# Patient Record
Sex: Male | Born: 1965 | Race: Black or African American | Hispanic: No | Smoking: Current every day smoker
Health system: Southern US, Community
[De-identification: ages and names within clinical notes are randomized; demographics above are authoritative.]

## PROBLEM LIST (undated history)

## (undated) DIAGNOSIS — E119 Type 2 diabetes mellitus without complications: Secondary | ICD-10-CM

## (undated) DIAGNOSIS — I10 Essential (primary) hypertension: Secondary | ICD-10-CM

## (undated) HISTORY — PX: NO PAST SURGERIES: SHX2092

---

## 2012-12-30 ENCOUNTER — Emergency Department (HOSPITAL_COMMUNITY)
Admission: EM | Admit: 2012-12-30 | Discharge: 2012-12-30 | Disposition: A | Discharge status: Home / Self Care | Payer: Medicare Other | Attending: Emergency Medicine | Admitting: Emergency Medicine

## 2012-12-30 ENCOUNTER — Encounter (HOSPITAL_COMMUNITY): Payer: Self-pay | Admitting: Nurse Practitioner

## 2012-12-30 DIAGNOSIS — M545 Low back pain, unspecified: Secondary | ICD-10-CM | POA: Insufficient documentation

## 2012-12-30 DIAGNOSIS — I10 Essential (primary) hypertension: Secondary | ICD-10-CM | POA: Insufficient documentation

## 2012-12-30 DIAGNOSIS — Z794 Long term (current) use of insulin: Secondary | ICD-10-CM | POA: Insufficient documentation

## 2012-12-30 DIAGNOSIS — F17200 Nicotine dependence, unspecified, uncomplicated: Secondary | ICD-10-CM | POA: Insufficient documentation

## 2012-12-30 DIAGNOSIS — E119 Type 2 diabetes mellitus without complications: Secondary | ICD-10-CM | POA: Insufficient documentation

## 2012-12-30 DIAGNOSIS — Z760 Encounter for issue of repeat prescription: Secondary | ICD-10-CM | POA: Insufficient documentation

## 2012-12-30 DIAGNOSIS — Z79899 Other long term (current) drug therapy: Secondary | ICD-10-CM | POA: Insufficient documentation

## 2012-12-30 DIAGNOSIS — G8929 Other chronic pain: Secondary | ICD-10-CM | POA: Insufficient documentation

## 2012-12-30 HISTORY — DX: Essential (primary) hypertension: I10

## 2012-12-30 HISTORY — DX: Type 2 diabetes mellitus without complications: E11.9

## 2012-12-30 MED ORDER — VERAPAMIL HCL 80 MG PO TABS: 80.0000 mg | ORAL_TABLET | Freq: Three times a day (TID) | ORAL | Status: DC

## 2012-12-30 MED ORDER — INSULIN GLARGINE 100 UNIT/ML ~~LOC~~ SOLN: 80.0000 [IU] | Freq: Every day | SUBCUTANEOUS | Status: DC

## 2012-12-30 MED ORDER — OXYCODONE-ACETAMINOPHEN 5-325 MG PO TABS: ORAL_TABLET | ORAL | Status: DC

## 2012-12-30 MED ORDER — OXYCODONE-ACETAMINOPHEN 5-325 MG PO TABS
2.0000 | ORAL_TABLET | Freq: Once | ORAL | Status: AC
  Administered 2012-12-30: 2 via ORAL
  Filled 2012-12-30: qty 2

## 2012-12-30 MED ORDER — INSULIN ASPART 100 UNIT/ML ~~LOC~~ SOLN: 15.0000 [IU] | Freq: Three times a day (TID) | SUBCUTANEOUS | Status: DC

## 2012-12-30 MED ORDER — HYDROCHLOROTHIAZIDE 12.5 MG PO TABS: 12.5000 mg | ORAL_TABLET | Freq: Every day | ORAL | Status: DC

## 2012-12-30 NOTE — ED Notes (Signed)
Pt reports lower back pain radiating down BLE x 2 weeks, denies past history of back pain, denies injuries. Also pt has hx htn and ran out of meds when he moved here 2 weeks ago.

## 2012-12-30 NOTE — ED Provider Notes (Signed)
CSN: [id number]     Arrival date & time 12/30/12  1138 History     First MD Initiated Contact with Patient 12/30/12 1221     Chief Complaint  Patient presents with  . Back Pain   (Consider location/radiation/quality/duration/timing/severity/associated sxs/prior Treatment) HPI  Bradley Mason is a 47 y.o. male complaining of exacerbation of chronic low back pain. Patient complains of low back pain radiating down the bilateral lower extremities just past the knee for the last 2 weeks. He states this is typical of his prior exacerbations. He denies any injuries, fever, change in bowel or bladder habits, history of IV drug use or cancer, numbness or weakness. He said recently moved to the area, has not established primary care, has been out of unknown hypertension medication for approximately one month he states that he is low on his insulin as well. Patient denies any chest pain, shortness of breath, headache, neck pain, change in vision, dysarthria, ataxia, abdominal pain, nausea vomiting.   Past Medical History  Diagnosis Date  . Diabetes mellitus without complication   . Hypertension    History reviewed. No pertinent past surgical history. History reviewed. No pertinent family history. History  Substance Use Topics  . Smoking status: Current Every Day Smoker    Types: Cigarettes  . Smokeless tobacco: Not on file  . Alcohol Use: No    Review of Systems 10 systems reviewed and found to be negative, except as noted in the HPI   Allergies  Review of patient's allergies indicates no known allergies.  Home Medications   Current Outpatient Rx  Name  Route  Sig  Dispense  Refill  . insulin aspart (NOVOLOG) 100 UNIT/ML injection   Subcutaneous   Inject 15 Units into the skin 3 (three) times daily with meals.         . insulin glargine (LANTUS) 100 UNIT/ML injection   Subcutaneous   Inject 80 Units into the skin at bedtime.         Marland Kitchen PRESCRIPTION MEDICATION   Oral   Take 1  tablet by mouth daily. Blood pressure medication          BP 163/106  Temp(Src) 97.9 F (36.6 C) (Oral)  Resp 20  SpO2 98% Physical Exam  Nursing note and vitals reviewed. Constitutional: He is oriented to person, place, and time. He appears well-developed and well-nourished. No distress.  HENT:  Head: Normocephalic.  Mouth/Throat: No oropharyngeal exudate.  Eyes: Conjunctivae and EOM are normal. Pupils are equal, round, and reactive to light.  Neck: Normal range of motion.  Cardiovascular: Normal rate, regular rhythm and intact distal pulses.   Pulmonary/Chest: Effort normal and breath sounds normal. No stridor. No respiratory distress. He has no wheezes. He has no rales. He exhibits no tenderness.  Abdominal: Soft. Bowel sounds are normal. He exhibits no distension and no mass. There is no tenderness. There is no rebound and no guarding.  Musculoskeletal: Normal range of motion.  Strength is 5 out of 5 to bilateral lower extremities at hip and knee, extensor hallucis longus 5 out of 5. Ankle strength 5 out of 5, no clonus, neurovascularly intact.  Neurological: He is alert and oriented to person, place, and time.  Psychiatric: He has a normal mood and affect.    ED Course   Procedures (including critical care time)  Labs Reviewed - No data to display No results found. No diagnosis found.  MDM   Filed Vitals:   12/30/12 1145  BP: 163/106  Temp: 97.9 F (36.6 C)  TempSrc: Oral  Resp: 20  SpO2: 98%     Bradley Mason is a 47 y.o. male presenting with elevated blood pressure secondary to noncompliance, he patient has a history of hypertension and insulin-dependent diabetes he does not have a primary care doctor and is out of his medications. Patient also is complaining of exacerbation of chronic low back pain. No Red Flags. Patient's meds will be refilled and patient will be asked to follow with private hospitalist wellness clinic. Return precautions  discussed.  Medications  oxyCODONE-acetaminophen (PERCOCET/ROXICET) 5-325 MG per tablet 2 tablet (2 tablets Oral Given 12/30/12 1308)    Pt is hemodynamically stable, appropriate for, and amenable to discharge at this time. Pt verbalized understanding and agrees with care plan. All questions answered. Outpatient follow-up and specific return precautions discussed.    Discharge Medication List as of 12/30/2012 12:52 PM    START taking these medications   Details  hydrochlorothiazide (HYDRODIURIL) 12.5 MG tablet Take 1 tablet (12.5 mg total) by mouth daily. Take in the AM, Starting 12/30/2012, Until Discontinued, Print    !! insulin aspart (NOVOLOG) 100 UNIT/ML injection Inject 15 Units into the skin 3 (three) times daily with meals., Starting 12/30/2012, Until Discontinued, Print    !! insulin glargine (LANTUS) 100 UNIT/ML injection Inject 0.8 mLs (80 Units total) into the skin at bedtime., Starting 12/30/2012, Until Discontinued, Print    oxyCODONE-acetaminophen (PERCOCET/ROXICET) 5-325 MG per tablet 1 to 2 tabs PO q6hrs  PRN for pain, Print    verapamil (CALAN) 80 MG tablet Take 1 tablet (80 mg total) by mouth 3 (three) times daily., Starting 12/30/2012, Until Discontinued, Print     !! - Potential duplicate medications found. Please discuss with provider.      Note: Portions of this report may have been transcribed using voice recognition software. Every effort was made to ensure accuracy; however, inadvertent computerized transcription errors may be present    Wynetta Emery, PA-C 12/31/12 [phone]

## 2012-12-31 NOTE — ED Provider Notes (Signed)
Medical screening examination/treatment/procedure(s) were performed by non-physician practitioner and as supervising physician I was immediately available for consultation/collaboration.  Danyon Mcginness L Aseel Uhde, MD 12/31/12 1056 

## 2013-01-09 ENCOUNTER — Ambulatory Visit: Payer: Medicare Other

## 2013-02-09 ENCOUNTER — Emergency Department (HOSPITAL_COMMUNITY)
Admission: EM | Admit: 2013-02-09 | Discharge: 2013-02-09 | Disposition: A | Discharge status: Home / Self Care | Payer: Medicare Other | Attending: Emergency Medicine | Admitting: Emergency Medicine

## 2013-02-09 ENCOUNTER — Encounter (HOSPITAL_COMMUNITY): Payer: Self-pay | Admitting: Emergency Medicine

## 2013-02-09 DIAGNOSIS — R7309 Other abnormal glucose: Secondary | ICD-10-CM | POA: Insufficient documentation

## 2013-02-09 DIAGNOSIS — F17200 Nicotine dependence, unspecified, uncomplicated: Secondary | ICD-10-CM | POA: Insufficient documentation

## 2013-02-09 DIAGNOSIS — Z79899 Other long term (current) drug therapy: Secondary | ICD-10-CM | POA: Insufficient documentation

## 2013-02-09 DIAGNOSIS — Z794 Long term (current) use of insulin: Secondary | ICD-10-CM | POA: Insufficient documentation

## 2013-02-09 DIAGNOSIS — E860 Dehydration: Secondary | ICD-10-CM | POA: Insufficient documentation

## 2013-02-09 DIAGNOSIS — I10 Essential (primary) hypertension: Secondary | ICD-10-CM | POA: Insufficient documentation

## 2013-02-09 DIAGNOSIS — R42 Dizziness and giddiness: Secondary | ICD-10-CM

## 2013-02-09 DIAGNOSIS — E119 Type 2 diabetes mellitus without complications: Secondary | ICD-10-CM | POA: Insufficient documentation

## 2013-02-09 DIAGNOSIS — R739 Hyperglycemia, unspecified: Secondary | ICD-10-CM

## 2013-02-09 DIAGNOSIS — R000 Tachycardia, unspecified: Secondary | ICD-10-CM | POA: Insufficient documentation

## 2013-02-09 LAB — POCT I-STAT 3, ART BLOOD GAS (G3+)
Acid-base deficit: 3 mmol/L — ABNORMAL HIGH (ref 0.0–2.0)
Bicarbonate: 21.7 mEq/L (ref 20.0–24.0)
O2 Saturation: 95 %
Patient temperature: 98.6
TCO2: 23 mmol/L (ref 0–100)
pO2, Arterial: 75 mmHg — ABNORMAL LOW (ref 80.0–100.0)

## 2013-02-09 LAB — BASIC METABOLIC PANEL
Chloride: 94 mEq/L — ABNORMAL LOW (ref 96–112)
GFR calc Af Amer: >90 mL/min (ref >90)
GFR calc non Af Amer: >90 mL/min (ref >90)
Potassium: 5.5 mEq/L — ABNORMAL HIGH (ref 3.5–5.1)
Sodium: 130 mEq/L — ABNORMAL LOW (ref 135–145)

## 2013-02-09 LAB — CBC
HCT: 46 % (ref 39.0–52.0)
Hemoglobin: 16.4 g/dL (ref 13.0–17.0)
MCHC: 35.7 g/dL (ref 30.0–36.0)
RDW: 14 % (ref 11.5–15.5)
WBC: 8.9 10*3/uL (ref 4.0–10.5)

## 2013-02-09 LAB — GLUCOSE, CAPILLARY: Glucose-Capillary: 501 mg/dL — ABNORMAL HIGH (ref 70–99)

## 2013-02-09 MED ORDER — MECLIZINE HCL 25 MG PO TABS: 25.0000 mg | ORAL_TABLET | Freq: Three times a day (TID) | ORAL | Status: AC | PRN

## 2013-02-09 MED ORDER — MECLIZINE HCL 25 MG PO TABS
25.0000 mg | ORAL_TABLET | Freq: Once | ORAL | Status: AC
  Administered 2013-02-09: 25 mg via ORAL
  Filled 2013-02-09: qty 1

## 2013-02-09 MED ORDER — SODIUM CHLORIDE 0.9 % IV BOLUS (SEPSIS)
1000.0000 mL | Freq: Once | INTRAVENOUS | Status: AC
  Administered 2013-02-09: 1000 mL via INTRAVENOUS

## 2013-02-09 MED ORDER — INSULIN ASPART 100 UNIT/ML ~~LOC~~ SOLN
10.0000 [IU] | Freq: Once | SUBCUTANEOUS | Status: AC
  Administered 2013-02-09: 10 [IU] via INTRAVENOUS
  Filled 2013-02-09: qty 1

## 2013-02-09 NOTE — ED Notes (Signed)
Called CT to check the status on his scan.  There are still 4 people in from of him

## 2013-02-09 NOTE — ED Notes (Signed)
Pt here c/o dizziness upon waking this am with "room spinning" and worse with position change

## 2013-02-09 NOTE — ED Provider Notes (Signed)
CSN: [id number]     Arrival date & time 02/09/13  1510 History   First MD Initiated Contact with Patient 02/09/13 1732     Chief Complaint  Patient presents with  . Dizziness   (Consider location/radiation/quality/duration/timing/severity/associated sxs/prior Treatment) HPI Comments: 47 yo male with uncontrolled DM, HTN presents with intermittent vertigo since 4 am. No hx of similar.  No other concerns.  Walking okay.  Worse with position.  No ringing.  No stroke hx.  No change in insulin or dosing.  Improves with lying still. Smoker.  The history is provided by the patient.    Past Medical History  Diagnosis Date  . Diabetes mellitus without complication   . Hypertension    History reviewed. No pertinent past surgical history. History reviewed. No pertinent family history. History  Substance Use Topics  . Smoking status: Current Every Day Smoker    Types: Cigarettes  . Smokeless tobacco: Not on file  . Alcohol Use: No    Review of Systems  Constitutional: Negative for fever and chills.  HENT: Negative for neck pain and neck stiffness.   Eyes: Negative for visual disturbance.  Respiratory: Negative for shortness of breath.   Cardiovascular: Negative for chest pain.  Gastrointestinal: Negative for vomiting and abdominal pain.  Genitourinary: Negative for dysuria and flank pain.  Musculoskeletal: Negative for back pain.  Skin: Negative for rash.  Neurological: Positive for dizziness and light-headedness. Negative for syncope, weakness and headaches.    Allergies  Review of patient's allergies indicates no known allergies.  Home Medications   Current Outpatient Rx  Name  Route  Sig  Dispense  Refill  . hydrochlorothiazide (HYDRODIURIL) 12.5 MG tablet   Oral   Take 1 tablet (12.5 mg total) by mouth daily. Take in the AM   30 tablet   1   . insulin aspart (NOVOLOG) 100 UNIT/ML injection   Subcutaneous   Inject 15 Units into the skin 3 (three) times daily with  meals.         . insulin aspart (NOVOLOG) 100 UNIT/ML injection   Subcutaneous   Inject 15 Units into the skin 3 (three) times daily with meals.   1 vial   12   . insulin glargine (LANTUS) 100 UNIT/ML injection   Subcutaneous   Inject 80 Units into the skin at bedtime.         . insulin glargine (LANTUS) 100 UNIT/ML injection   Subcutaneous   Inject 0.8 mLs (80 Units total) into the skin at bedtime.   10 mL   12   . lisinopril (PRINIVIL,ZESTRIL) 20 MG tablet   Oral   Take 20 mg by mouth daily.         Marland Kitchen oxyCODONE-acetaminophen (PERCOCET/ROXICET) 5-325 MG per tablet      1 to 2 tabs PO q6hrs  PRN for pain   15 tablet   0   . verapamil (CALAN) 80 MG tablet   Oral   Take 1 tablet (80 mg total) by mouth 3 (three) times daily.   90 tablet   2    BP 124/97  Pulse 109  Temp(Src) 99.1 F (37.3 C) (Oral)  Resp 22  SpO2 97% Physical Exam  Nursing note and vitals reviewed. Constitutional: He is oriented to person, place, and time. He appears well-developed and well-nourished.  HENT:  Head: Normocephalic and atraumatic.  Mild dry mm  Eyes: Conjunctivae are normal. Right eye exhibits no discharge. Left eye exhibits no discharge.  Neck: Normal range  of motion. Neck supple. No tracheal deviation present.  Cardiovascular: Regular rhythm.  Tachycardia present.   Pulmonary/Chest: Effort normal and breath sounds normal.  Abdominal: Soft. He exhibits no distension. There is no tenderness. There is no guarding.  Musculoskeletal: He exhibits no edema.  Neurological: He is alert and oriented to person, place, and time. He has normal strength. No cranial nerve deficit. Coordination normal. GCS eye subscore is 4. GCS verbal subscore is 5. GCS motor subscore is 6.  5+ strength in UE and LE with f/e at major joints. Sensation to palpation intact in UE and LE. CNs 2-12 grossly intact.  EOMFI.  PERRL.   Finger nose and coordination intact bilateral.   Visual fields intact to finger  testing.   Skin: Skin is warm. No rash noted.  Psychiatric: He has a normal mood and affect.    ED Course  Procedures (including critical care time) Dix hallpike and Epley maneuver Labs Review Labs Reviewed  CBC - Abnormal; Notable for the following:    RBC 5.92 (*)    MCV 77.7 (*)    All other components within normal limits  BASIC METABOLIC PANEL - Abnormal; Notable for the following:    Sodium 130 (*)    Potassium 5.5 (*)    Chloride 94 (*)    Glucose, Bld 617 (*)    All other components within normal limits  GLUCOSE, CAPILLARY - Abnormal; Notable for the following:    Glucose-Capillary 547 (*)    All other components within normal limits  GLUCOSE, CAPILLARY - Abnormal; Notable for the following:    Glucose-Capillary 501 (*)    All other components within normal limits  POTASSIUM   Imaging Review No results found.  MDM  No diagnosis found. Clinically uncontrolled DM - fluids and insulin given.  Bicarb normal. VBG to check pH. Only concern is vertigo.  Normal neuro exam - brought on in ED with head movement. Dix hallpike with mild horiz nystagmus to left followed by Epley with mild improvement.  CT head to look for other cause. Clinically periph vertigo but pt does have two risk factors for stroke. Vertigo resolved with epley and fluids.  CT delayed due to busy department.  Pt wishes to go home and not wait for CT and see pcp on Monday since he has no sxs.  He understands that we cannot rule out  CNS diagnosis but with normal neuro exam/ no sxs on recheck stroke unlikely.  DC with meclizine Glu improved in ED.  Wife with pt.   Hyperglycemia, Vertigo, Dehydration  Enid Skeens, MD 02/09/13 2027

## 2013-05-17 ENCOUNTER — Inpatient Hospital Stay (HOSPITAL_COMMUNITY)
Admission: EM | Admit: 2013-05-17 | Discharge: 2013-05-19 | DRG: 638 | Disposition: A | Discharge status: Home / Self Care | Payer: Medicare Other | Attending: Internal Medicine | Admitting: Internal Medicine

## 2013-05-17 ENCOUNTER — Encounter (HOSPITAL_COMMUNITY): Payer: Self-pay | Admitting: Emergency Medicine

## 2013-05-17 ENCOUNTER — Emergency Department (HOSPITAL_COMMUNITY): Payer: Medicare Other

## 2013-05-17 DIAGNOSIS — I10 Essential (primary) hypertension: Secondary | ICD-10-CM | POA: Diagnosis present

## 2013-05-17 DIAGNOSIS — K5289 Other specified noninfective gastroenteritis and colitis: Secondary | ICD-10-CM | POA: Diagnosis present

## 2013-05-17 DIAGNOSIS — E871 Hypo-osmolality and hyponatremia: Secondary | ICD-10-CM | POA: Diagnosis present

## 2013-05-17 DIAGNOSIS — E1310 Other specified diabetes mellitus with ketoacidosis without coma: Principal | ICD-10-CM | POA: Diagnosis present

## 2013-05-17 DIAGNOSIS — Z91199 Patient's noncompliance with other medical treatment and regimen due to unspecified reason: Secondary | ICD-10-CM

## 2013-05-17 DIAGNOSIS — Z9119 Patient's noncompliance with other medical treatment and regimen: Secondary | ICD-10-CM

## 2013-05-17 DIAGNOSIS — F17200 Nicotine dependence, unspecified, uncomplicated: Secondary | ICD-10-CM

## 2013-05-17 DIAGNOSIS — I959 Hypotension, unspecified: Secondary | ICD-10-CM | POA: Diagnosis present

## 2013-05-17 DIAGNOSIS — R197 Diarrhea, unspecified: Secondary | ICD-10-CM | POA: Diagnosis present

## 2013-05-17 DIAGNOSIS — E876 Hypokalemia: Secondary | ICD-10-CM | POA: Diagnosis present

## 2013-05-17 DIAGNOSIS — Z823 Family history of stroke: Secondary | ICD-10-CM

## 2013-05-17 DIAGNOSIS — R112 Nausea with vomiting, unspecified: Secondary | ICD-10-CM | POA: Diagnosis present

## 2013-05-17 DIAGNOSIS — E1110 Type 2 diabetes mellitus with ketoacidosis without coma: Secondary | ICD-10-CM | POA: Diagnosis present

## 2013-05-17 DIAGNOSIS — Z794 Long term (current) use of insulin: Secondary | ICD-10-CM

## 2013-05-17 DIAGNOSIS — Z833 Family history of diabetes mellitus: Secondary | ICD-10-CM

## 2013-05-17 DIAGNOSIS — Z720 Tobacco use: Secondary | ICD-10-CM | POA: Diagnosis present

## 2013-05-17 LAB — URINALYSIS, ROUTINE W REFLEX MICROSCOPIC
Bilirubin Urine: NEGATIVE
Glucose, UA: >1000 mg/dL — AB
Hgb urine dipstick: NEGATIVE
Ketones, ur: 15 mg/dL — AB
Leukocytes, UA: NEGATIVE
Nitrite: NEGATIVE
Protein, ur: 30 mg/dL — AB
Specific Gravity, Urine: 1.027 (ref 1.005–1.030)
Urobilinogen, UA: 0.2 mg/dL (ref 0.0–1.0)
pH: 5 (ref 5.0–8.0)

## 2013-05-17 LAB — RAPID URINE DRUG SCREEN, HOSP PERFORMED
Amphetamines: NOT DETECTED
Barbiturates: NOT DETECTED
Benzodiazepines: NOT DETECTED
Cocaine: NOT DETECTED
Opiates: NOT DETECTED
Tetrahydrocannabinol: NOT DETECTED

## 2013-05-17 LAB — COMPREHENSIVE METABOLIC PANEL
ALT: 14 U/L (ref 0–53)
Albumin: 3.4 g/dL — ABNORMAL LOW (ref 3.5–5.2)
Alkaline Phosphatase: 92 U/L (ref 39–117)
CO2: 17 mEq/L — ABNORMAL LOW (ref 19–32)
Calcium: 8.8 mg/dL (ref 8.4–10.5)
GFR calc Af Amer: >90 mL/min (ref >90)
Glucose, Bld: 386 mg/dL — ABNORMAL HIGH (ref 70–99)
Potassium: 4 mEq/L (ref 3.5–5.1)
Sodium: 129 mEq/L — ABNORMAL LOW (ref 135–145)
Total Protein: 7.1 g/dL (ref 6.0–8.3)

## 2013-05-17 LAB — CBC
Hemoglobin: 14.6 g/dL (ref 13.0–17.0)
MCH: 26.6 pg (ref 26.0–34.0)
MCV: 77.2 fL — ABNORMAL LOW (ref 78.0–100.0)
RBC: 5.49 MIL/uL (ref 4.22–5.81)
RDW: 13.4 % (ref 11.5–15.5)
WBC: 7.8 10*3/uL (ref 4.0–10.5)

## 2013-05-17 LAB — BASIC METABOLIC PANEL
BUN: 10 mg/dL (ref 6–23)
CO2: 20 mEq/L (ref 19–32)
Calcium: 8.2 mg/dL — ABNORMAL LOW (ref 8.4–10.5)
Chloride: 101 mEq/L (ref 96–112)
Creatinine, Ser: 0.65 mg/dL (ref 0.50–1.35)
Glucose, Bld: 218 mg/dL — ABNORMAL HIGH (ref 70–99)
Sodium: 133 mEq/L — ABNORMAL LOW (ref 135–145)

## 2013-05-17 LAB — CBC WITH DIFFERENTIAL/PLATELET
Basophils Absolute: 0 10*3/uL (ref 0.0–0.1)
Basophils Relative: 0 % (ref 0–1)
Eosinophils Absolute: 0.1 10*3/uL (ref 0.0–0.7)
Eosinophils Relative: 1 % (ref 0–5)
HCT: 45.1 % (ref 39.0–52.0)
Hemoglobin: 15.9 g/dL (ref 13.0–17.0)
Lymphocytes Relative: 15 % (ref 12–46)
Lymphs Abs: 1.6 10*3/uL (ref 0.7–4.0)
MCH: 27.3 pg (ref 26.0–34.0)
MCHC: 35.3 g/dL (ref 30.0–36.0)
MCV: 77.5 fL — ABNORMAL LOW (ref 78.0–100.0)
Monocytes Absolute: 0.5 K/uL (ref 0.1–1.0)
Monocytes Relative: 4 % (ref 3–12)
Neutro Abs: 8.5 10*3/uL — ABNORMAL HIGH (ref 1.7–7.7)
Neutrophils Relative %: 80 % — ABNORMAL HIGH (ref 43–77)
Platelets: 221 10*3/uL (ref 150–400)
RBC: 5.82 MIL/uL — ABNORMAL HIGH (ref 4.22–5.81)
RDW: 13.5 % (ref 11.5–15.5)
WBC: 10.6 10*3/uL — ABNORMAL HIGH (ref 4.0–10.5)

## 2013-05-17 LAB — COMPREHENSIVE METABOLIC PANEL WITH GFR
AST: 14 U/L (ref 0–37)
BUN: 13 mg/dL (ref 6–23)
Chloride: 95 meq/L — ABNORMAL LOW (ref 96–112)
Creatinine, Ser: 0.9 mg/dL (ref 0.50–1.35)
GFR calc non Af Amer: >90 mL/min (ref >90)
Total Bilirubin: 0.3 mg/dL (ref 0.3–1.2)

## 2013-05-17 LAB — GLUCOSE, CAPILLARY
Glucose-Capillary: 344 mg/dL — ABNORMAL HIGH (ref 70–99)
Glucose-Capillary: 353 mg/dL — ABNORMAL HIGH (ref 70–99)
Glucose-Capillary: 302 mg/dL — ABNORMAL HIGH (ref 70–99)
Glucose-Capillary: 203 mg/dL — ABNORMAL HIGH (ref 70–99)
Glucose-Capillary: 248 mg/dL — ABNORMAL HIGH (ref 70–99)

## 2013-05-17 LAB — KETONES, QUALITATIVE: Acetone, Bld: NEGATIVE

## 2013-05-17 LAB — URINE MICROSCOPIC-ADD ON

## 2013-05-17 LAB — CG4 I-STAT (LACTIC ACID): Lactic Acid, Venous: 1.76 mmol/L (ref 0.5–2.2)

## 2013-05-17 LAB — MRSA PCR SCREENING: MRSA by PCR: NEGATIVE

## 2013-05-17 MED ORDER — POTASSIUM CHLORIDE CRYS ER 20 MEQ PO TBCR
20.0000 meq | EXTENDED_RELEASE_TABLET | Freq: Once | ORAL | Status: AC
  Administered 2013-05-17: 20 meq via ORAL
  Filled 2013-05-17: qty 1

## 2013-05-17 MED ORDER — SODIUM CHLORIDE 0.9 % IV SOLN
INTRAVENOUS | Status: DC
  Administered 2013-05-17: 19:00:00 via INTRAVENOUS

## 2013-05-17 MED ORDER — INSULIN REGULAR HUMAN 100 UNIT/ML IJ SOLN
INTRAMUSCULAR | Status: DC
  Administered 2013-05-17: 2.9 [IU]/h via INTRAVENOUS
  Filled 2013-05-17: qty 1

## 2013-05-17 MED ORDER — SODIUM CHLORIDE 0.9 % IV SOLN
INTRAVENOUS | Status: DC
  Filled 2013-05-17: qty 1

## 2013-05-17 MED ORDER — ENOXAPARIN SODIUM 40 MG/0.4ML ~~LOC~~ SOLN
40.0000 mg | SUBCUTANEOUS | Status: DC
  Administered 2013-05-17 – 2013-05-18 (×2): 40 mg via SUBCUTANEOUS
  Filled 2013-05-17 (×3): qty 0.4

## 2013-05-17 MED ORDER — POTASSIUM CHLORIDE 10 MEQ/100ML IV SOLN
10.0000 meq | INTRAVENOUS | Status: DC
  Administered 2013-05-17: 10 meq via INTRAVENOUS
  Filled 2013-05-17: qty 100

## 2013-05-17 MED ORDER — POTASSIUM CHLORIDE CRYS ER 20 MEQ PO TBCR: 20.0000 meq | EXTENDED_RELEASE_TABLET | ORAL | Status: DC

## 2013-05-17 MED ORDER — SODIUM CHLORIDE 0.9 % IV BOLUS (SEPSIS)
1000.0000 mL | Freq: Once | INTRAVENOUS | Status: AC
  Administered 2013-05-17: 1000 mL via INTRAVENOUS

## 2013-05-17 MED ORDER — SODIUM CHLORIDE 0.9 % IV SOLN
INTRAVENOUS | Status: DC
  Administered 2013-05-18: 09:00:00 via INTRAVENOUS

## 2013-05-17 MED ORDER — DEXTROSE 50 % IV SOLN: 25.0000 mL | INTRAVENOUS | Status: DC | PRN

## 2013-05-17 MED ORDER — DEXTROSE-NACL 5-0.45 % IV SOLN
INTRAVENOUS | Status: DC
  Administered 2013-05-17 – 2013-05-18 (×2): via INTRAVENOUS

## 2013-05-17 MED ORDER — HYDRALAZINE HCL 20 MG/ML IJ SOLN
10.0000 mg | INTRAMUSCULAR | Status: DC | PRN
  Administered 2013-05-17: 10 mg via INTRAVENOUS
  Filled 2013-05-17: qty 1

## 2013-05-17 MED ORDER — HYDROCODONE-ACETAMINOPHEN 5-325 MG PO TABS
2.0000 | ORAL_TABLET | Freq: Four times a day (QID) | ORAL | Status: DC | PRN
  Administered 2013-05-18 (×2): 2 via ORAL
  Filled 2013-05-17 (×2): qty 2

## 2013-05-17 MED ORDER — HYDROCODONE-ACETAMINOPHEN 5-325 MG PO TABS
1.0000 | ORAL_TABLET | Freq: Four times a day (QID) | ORAL | Status: DC | PRN
  Administered 2013-05-17: 1 via ORAL
  Filled 2013-05-17: qty 1

## 2013-05-17 NOTE — ED Notes (Signed)
Pt initially presented for med refills as he does not have a PCP and needs BP and DM meds refilled.  Upon coming back to triage pt became more sluggish and found to be hypotensive.  Pt now arousable to speech.  Pt c/o pain all over.

## 2013-05-17 NOTE — H&P (Signed)
[facility] History and Physical  Bradley Mason ZOX:[id number] DOB: [birth year] DOA: 05/17/2013  Referring physician: ER physician. PCP: No PCP Per Patient patient recently moved from [place] and does not have a PCP now.  Chief Complaint: Weakness.  HPI: Bradley Mason is a 47 y.o. male with history of diabetes mellitus hypertension and ongoing tobacco abuse had originally come to the ER to get medication refill when while waiting patient became weak and almost passed out. Patient was brought in to the ER and was found to be hypotensive with blood pressure in the 70s systolic. Patient was given 2 L normal saline bolus after which patient's blood pressure has improved with systolic more than 100 and heart rate has come down into the 60s. Patient denies any chest pain shortness of breath nausea vomiting. Patient did have some abdominal cramps with one episode of diarrhea after patient was admitted. Patient presently denies abdominal pain fever chills. Denies having used any recent antibiotics. Labs revealed elevated anion gap consistent with possible DKA. Patient states he has not been taking his medications for last 2 weeks because he ran out of it. Patient will be admitted for further management. Patient has been started on IV insulin infusion and IV fluids.   Review of Systems: As presented in the history of presenting illness, rest negative.  Past Medical History  Diagnosis Date  . Diabetes mellitus without complication   . Hypertension    Past Surgical History  Procedure Laterality Date  . No past surgeries     Social History:  reports that he has been smoking Cigarettes.  He has been smoking about 0.00 packs per day. He does not have any smokeless tobacco history on file. He reports that he does not drink alcohol or use illicit drugs. Where does patient live home. Can patient participate in ADLs? Yes.  No Known Allergies  Family History:  Family History  Problem Relation Age  of Onset  . Diabetes Mellitus II Mother   . Diabetes Mellitus II Father   . Diabetes Mellitus II Brother   . Stroke Brother       Prior to Admission medications   Medication Sig Start Date End Date Taking? Authorizing Provider  hydrochlorothiazide (HYDRODIURIL) 12.5 MG tablet Take 1 tablet (12.5 mg total) by mouth daily. Take in the AM 12/30/12  Yes Nicole Pisciotta, PA-C  insulin aspart (NOVOLOG) 100 UNIT/ML injection Inject 15 Units into the skin 3 (three) times daily with meals.   Yes Historical Provider, MD  insulin glargine (LANTUS) 100 UNIT/ML injection Inject 0.8 mLs (80 Units total) into the skin at bedtime. 12/30/12  Yes Nicole Pisciotta, PA-C  lisinopril (PRINIVIL,ZESTRIL) 20 MG tablet Take 20 mg by mouth daily.   Yes Historical Provider, MD  meclizine (ANTIVERT) 25 MG tablet Take 1 tablet (25 mg total) by mouth 3 (three) times daily as needed for dizziness. 02/09/13  Yes Enid Skeens, MD  verapamil (CALAN) 80 MG tablet Take 80 mg by mouth 2 (two) times daily. 12/30/12  Yes Nicole Pisciotta, PA-C    Physical Exam: Filed Vitals:   05/17/13 1630 05/17/13 1700 05/17/13 1715 05/17/13 1828  BP: 125/89 124/78 121/81 116/78  Pulse: 103 106 102 108  Temp:      TempSrc:      Resp: 23 28 23 20   SpO2: 98% 98% 98% 97%     General:  Well-developed and nourished.  Eyes: Anicteric no pallor.  ENT: No discharge from ears eyes nose mouth.  Neck: No mass  felt.  Cardiovascular: S1-S2 heard.  Respiratory: No rhonchi or crepitations.  Abdomen: Soft nontender bowel sounds present no guarding no rigidity.  Skin: No rash.  Musculoskeletal: No edema.  Psychiatric: Appears normal.  Neurologic: Alert awake oriented to time place and person. Moves all extremities.  Labs on Admission:  Basic Metabolic Panel:  Recent Labs Lab 05/17/13 1456  NA 129*  K 4.0  CL 95*  CO2 17*  GLUCOSE 386*  BUN 13  CREATININE 0.90  CALCIUM 8.8   Liver Function Tests:  Recent Labs Lab  05/17/13 1456  AST 14  ALT 14  ALKPHOS 92  BILITOT 0.3  PROT 7.1  ALBUMIN 3.4*   No results found for this basename: LIPASE, AMYLASE,  in the last 168 hours No results found for this basename: AMMONIA,  in the last 168 hours CBC:  Recent Labs Lab 05/17/13 1509  WBC 10.6*  NEUTROABS 8.5*  HGB 15.9  HCT 45.1  MCV 77.5*  PLT 221   Cardiac Enzymes: No results found for this basename: CKTOTAL, CKMB, CKMBINDEX, TROPONINI,  in the last 168 hours  BNP (last 3 results) No results found for this basename: PROBNP,  in the last 8760 hours CBG:  Recent Labs Lab 05/17/13 1457 05/17/13 1537 05/17/13 1854  GLUCAP 344* 353* 302*    Radiological Exams on Admission: Dg Chest Port 1 View  05/17/2013   CLINICAL DATA:  Chest pain.  EXAM: PORTABLE CHEST - 1 VIEW  COMPARISON:  None.  FINDINGS: Low volume chest. Cardiopericardial silhouette appears within normal limits allowing for volumes. Monitoring leads project over the chest. No focal consolidation. No pleural effusion is identified.  IMPRESSION: Low volume chest.   Electronically Signed   By: Andreas Newport M.D.   On: 05/17/2013 18:01     Assessment/Plan Principal Problem:   DKA (diabetic ketoacidoses) Active Problems:   Diarrhea   HTN (hypertension)   Tobacco abuse   1. Diabetic ketoacidosis - probably secondary to noncompliance with medication as patient has ran out of medication. Continue with IV insulin and IV fluids until anion gap gets corrected. After which we can start patient's home dose of Lantus. 2. Hypotension - probably secondary to dehydration. At this time I'm holding off patient's antihypertensives. Patient's hypotension did respond to IV fluids. Check cardiac markers and EKG. 3. History of hypertension - due to initial presentation of hypotension I'm holding off antihypertensives for now and keeping patient on when necessary IV hydralazine for systolic blood pressure more than 160. 4. Hyponatremia - probably  from dehydration and hyperglycemia. Recheck metabolic panel closely after hydration. 5. Diarrhea - patient had one episode of crampy abdominal pain diarrhea. Presently on exam patient's abdomen appears benign. Closely observe. Check stool studies. 6. Tobacco abuse - tobacco cessation counseling requested.    Code Status: Full code.  Family Communication: None.  Disposition Plan: Admit to inpatient.    Jeshawn Melucci N. [facility] Pager [phone].  If 7PM-7AM, please contact night-coverage [email or web address] Password [facility] 05/17/2013, 6:57 PM

## 2013-05-17 NOTE — ED Notes (Signed)
EDP at bedside with ultrasound 

## 2013-05-17 NOTE — ED Notes (Signed)
CBG done in triage 

## 2013-05-17 NOTE — ED Provider Notes (Signed)
CSN: [id number]     Arrival date & time 05/17/13  1431 History   First MD Initiated Contact with Patient 05/17/13 1524     Chief Complaint  Patient presents with  . Hypotension  . Altered Mental Status   (Consider location/radiation/quality/duration/timing/severity/associated sxs/prior Treatment) HPI Patient presented today for medication refills. While in triage he began to be sluggish and found to be hypotensive. His initial blood pressure in triage was 70/50 one and he was pulled back to the emergency room immediately. Per the patient and his family member, he has had increased urination, mild abdominal pain, fatigue for a few days worsening here recently. He has had mild nausea but no vomiting.   Past Medical History  Diagnosis Date  . Diabetes mellitus without complication   . Hypertension    Past Surgical History  Procedure Laterality Date  . No past surgeries     Family History  Problem Relation Age of Onset  . Diabetes Mellitus II Mother   . Diabetes Mellitus II Father   . Diabetes Mellitus II Brother   . Stroke Brother    History  Substance Use Topics  . Smoking status: Current Every Day Smoker    Types: Cigarettes  . Smokeless tobacco: Not on file  . Alcohol Use: No    Review of Systems  Constitutional: Positive for fatigue. Negative for fever and chills.  HENT: Negative for sore throat.   Eyes: Negative for pain.  Respiratory: Negative for cough and shortness of breath.   Cardiovascular: Negative for chest pain.  Gastrointestinal: Positive for nausea. Negative for vomiting and abdominal pain.  Endocrine: Positive for polydipsia and polyuria.  Genitourinary: Negative for dysuria and flank pain.  Musculoskeletal: Negative for back pain and neck pain.  Skin: Negative for rash.  Neurological: Negative for seizures and headaches.    Allergies  Review of patient's allergies indicates no known allergies.  Home Medications   No current outpatient  prescriptions on file. BP 129/74  Pulse 106  Temp(Src) 98.4 F (36.9 C) (Rectal)  Resp 29  Ht 5\' 7"  (1.702 m)  Wt 238 lb 5.1 oz (108.1 kg)  BMI 37.32 kg/m2  SpO2 98% Physical Exam  Constitutional: He is oriented to person, place, and time. He appears well-developed and well-nourished. No distress.  HENT:  Head: Normocephalic and atraumatic.  Eyes: Pupils are equal, round, and reactive to light.  Neck: Normal range of motion.  Cardiovascular: Regular rhythm and intact distal pulses.  Tachycardia present.   Pulmonary/Chest: Effort normal and breath sounds normal.  Abdominal: Soft. He exhibits no distension. There is no tenderness.  Musculoskeletal: Normal range of motion.  Neurological: He is alert and oriented to person, place, and time.  Skin: Skin is warm. He is not diaphoretic.   ED Course  Procedures (including critical care time) Labs Review Labs Reviewed  COMPREHENSIVE METABOLIC PANEL - Abnormal; Notable for the following:    Sodium 129 (*)    Chloride 95 (*)    CO2 17 (*)    Glucose, Bld 386 (*)    Albumin 3.4 (*)    All other components within normal limits  GLUCOSE, CAPILLARY - Abnormal; Notable for the following:    Glucose-Capillary 344 (*)    All other components within normal limits  CBC WITH DIFFERENTIAL - Abnormal; Notable for the following:    WBC 10.6 (*)    RBC 5.82 (*)    MCV 77.5 (*)    Neutrophils Relative % 80 (*)  Neutro Abs 8.5 (*)    All other components within normal limits  URINALYSIS, ROUTINE W REFLEX MICROSCOPIC - Abnormal; Notable for the following:    Glucose, UA >1000 (*)    Ketones, ur 15 (*)    Protein, ur 30 (*)    All other components within normal limits  GLUCOSE, CAPILLARY - Abnormal; Notable for the following:    Glucose-Capillary 353 (*)    All other components within normal limits  GLUCOSE, CAPILLARY - Abnormal; Notable for the following:    Glucose-Capillary 302 (*)    All other components within normal limits  URINE  MICROSCOPIC-ADD ON - Abnormal; Notable for the following:    Casts HYALINE CASTS (*)    All other components within normal limits  BASIC METABOLIC PANEL - Abnormal; Notable for the following:    Sodium 133 (*)    Glucose, Bld 218 (*)    Calcium 8.2 (*)    All other components within normal limits  CBC - Abnormal; Notable for the following:    MCV 77.2 (*)    All other components within normal limits  GLUCOSE, CAPILLARY - Abnormal; Notable for the following:    Glucose-Capillary 203 (*)    All other components within normal limits  GLUCOSE, CAPILLARY - Abnormal; Notable for the following:    Glucose-Capillary 212 (*)    All other components within normal limits  GLUCOSE, CAPILLARY - Abnormal; Notable for the following:    Glucose-Capillary 248 (*)    All other components within normal limits  GLUCOSE, CAPILLARY - Abnormal; Notable for the following:    Glucose-Capillary 191 (*)    All other components within normal limits  MRSA PCR SCREENING  URINE RAPID DRUG SCREEN (HOSP PERFORMED)  TROPONIN I  KETONES, QUALITATIVE  CBC WITH DIFFERENTIAL  TSH  LIPID PANEL  BASIC METABOLIC PANEL  BASIC METABOLIC PANEL  BASIC METABOLIC PANEL  CG4 I-STAT (LACTIC ACID)   Imaging Review Dg Chest Port 1 View  05/17/2013   CLINICAL DATA:  Chest pain.  EXAM: PORTABLE CHEST - 1 VIEW  COMPARISON:  None.  FINDINGS: Low volume chest. Cardiopericardial silhouette appears within normal limits allowing for volumes. Monitoring leads project over the chest. No focal consolidation. No pleural effusion is identified.  IMPRESSION: Low volume chest.   Electronically Signed   By: Andreas Newport M.D.   On: 05/17/2013 18:01    EKG Interpretation    Date/Time:    Ventricular Rate:    PR Interval:    QRS Duration:   QT Interval:    QTC Calculation:   R Axis:     Text Interpretation:              MDM   1. DKA (diabetic ketoacidoses)   2. Diarrhea   3. HTN (hypertension)   4. Tobacco abuse     47 yo M with hx of poorly controlled diabetes presents for AMS, hypotension.   BSUS performed as [facility] protocol. No change in aortic size. No evidence of ptx. No cardiac effusion. No intraabdominal fluid. Will perform basic labs, aggressively fluid resuscitate.   After 2 L NS IVF, patient normotensive. Likely dehydration 2/2 DKA. Patient mildly acidotic. Will start on insulin drip, admit to hospitalist service. Patient triaged to stepdown unit, admitted in stable condition. Patient seen and evaluated by myself and my attending, Dr. Oletta Lamas.      Imagene Sheller, MD 05/18/13 [phone]

## 2013-05-17 NOTE — ED Notes (Signed)
DR. Oletta Lamas NOTIFIED OF PATIENTS LAB RESULTS OF CG4+ LACTIC ACID = 1.76 mmoI/L ,@ 15:45 PM, 05/17/2013.

## 2013-05-18 DIAGNOSIS — E871 Hypo-osmolality and hyponatremia: Secondary | ICD-10-CM | POA: Diagnosis present

## 2013-05-18 DIAGNOSIS — E876 Hypokalemia: Secondary | ICD-10-CM | POA: Diagnosis not present

## 2013-05-18 DIAGNOSIS — R112 Nausea with vomiting, unspecified: Secondary | ICD-10-CM | POA: Diagnosis present

## 2013-05-18 LAB — BASIC METABOLIC PANEL
BUN: 8 mg/dL (ref 6–23)
BUN: 8 mg/dL (ref 6–23)
CO2: 20 mEq/L (ref 19–32)
Calcium: 8.1 mg/dL — ABNORMAL LOW (ref 8.4–10.5)
Calcium: 7.9 mg/dL — ABNORMAL LOW (ref 8.4–10.5)
Calcium: 8 mg/dL — ABNORMAL LOW (ref 8.4–10.5)
Chloride: 101 mEq/L (ref 96–112)
Creatinine, Ser: 0.64 mg/dL (ref 0.50–1.35)
Creatinine, Ser: 0.61 mg/dL (ref 0.50–1.35)
Creatinine, Ser: 0.58 mg/dL (ref 0.50–1.35)
GFR calc Af Amer: >90 mL/min (ref >90)
GFR calc Af Amer: >90 mL/min (ref >90)
GFR calc non Af Amer: >90 mL/min (ref >90)
GFR calc non Af Amer: >90 mL/min (ref >90)
GFR calc non Af Amer: >90 mL/min (ref >90)
Glucose, Bld: 215 mg/dL — ABNORMAL HIGH (ref 70–99)
Glucose, Bld: 242 mg/dL — ABNORMAL HIGH (ref 70–99)
Glucose, Bld: 275 mg/dL — ABNORMAL HIGH (ref 70–99)

## 2013-05-18 LAB — GLUCOSE, CAPILLARY
Glucose-Capillary: 136 mg/dL — ABNORMAL HIGH (ref 70–99)
Glucose-Capillary: 191 mg/dL — ABNORMAL HIGH (ref 70–99)
Glucose-Capillary: 248 mg/dL — ABNORMAL HIGH (ref 70–99)
Glucose-Capillary: 283 mg/dL — ABNORMAL HIGH (ref 70–99)

## 2013-05-18 LAB — CBC WITH DIFFERENTIAL/PLATELET
Basophils Absolute: 0 10*3/uL (ref 0.0–0.1)
Basophils Relative: 0 % (ref 0–1)
Eosinophils Relative: 1 % (ref 0–5)
HCT: 42 % (ref 39.0–52.0)
Lymphocytes Relative: 32 % (ref 12–46)
MCV: 77.6 fL — ABNORMAL LOW (ref 78.0–100.0)
Monocytes Absolute: 0.4 10*3/uL (ref 0.1–1.0)
Monocytes Relative: 6 % (ref 3–12)
Neutro Abs: 4.2 10*3/uL (ref 1.7–7.7)
Neutrophils Relative %: 61 % (ref 43–77)
RBC: 5.41 MIL/uL (ref 4.22–5.81)
RDW: 13.5 % (ref 11.5–15.5)
WBC: 6.8 10*3/uL (ref 4.0–10.5)

## 2013-05-18 LAB — LIPID PANEL
Cholesterol: 171 mg/dL (ref 0–200)
HDL: 34 mg/dL — ABNORMAL LOW (ref >39)
LDL Cholesterol: 107 mg/dL — ABNORMAL HIGH (ref 0–99)
Triglycerides: 149 mg/dL (ref <150)

## 2013-05-18 LAB — HEMOGLOBIN A1C: Hgb A1c MFr Bld: 13.6 % — ABNORMAL HIGH (ref <5.7)

## 2013-05-18 LAB — TSH: TSH: 2.23 u[IU]/mL (ref 0.350–4.500)

## 2013-05-18 MED ORDER — INSULIN GLARGINE 100 UNIT/ML ~~LOC~~ SOLN
60.0000 [IU] | Freq: Every day | SUBCUTANEOUS | Status: DC
  Administered 2013-05-18 (×2): 60 [IU] via SUBCUTANEOUS
  Filled 2013-05-18 (×3): qty 0.6

## 2013-05-18 MED ORDER — INSULIN ASPART 100 UNIT/ML ~~LOC~~ SOLN: 0.0000 [IU] | Freq: Every day | SUBCUTANEOUS | Status: DC

## 2013-05-18 MED ORDER — ONDANSETRON HCL 4 MG/2ML IJ SOLN
4.0000 mg | Freq: Four times a day (QID) | INTRAMUSCULAR | Status: DC | PRN
  Administered 2013-05-19: 4 mg via INTRAVENOUS
  Filled 2013-05-18 (×2): qty 2

## 2013-05-18 MED ORDER — SODIUM CHLORIDE 0.9 % IV SOLN
INTRAVENOUS | Status: DC
  Administered 2013-05-18: 11:00:00 via INTRAVENOUS

## 2013-05-18 MED ORDER — POTASSIUM CHLORIDE CRYS ER 20 MEQ PO TBCR
40.0000 meq | EXTENDED_RELEASE_TABLET | Freq: Once | ORAL | Status: AC
  Administered 2013-05-18: 40 meq via ORAL
  Filled 2013-05-18: qty 2

## 2013-05-18 MED ORDER — INSULIN ASPART 100 UNIT/ML ~~LOC~~ SOLN: 6.0000 [IU] | Freq: Three times a day (TID) | SUBCUTANEOUS | Status: DC

## 2013-05-18 MED ORDER — MECLIZINE HCL 25 MG PO TABS
25.0000 mg | ORAL_TABLET | Freq: Three times a day (TID) | ORAL | Status: DC | PRN
  Filled 2013-05-18: qty 1

## 2013-05-18 MED ORDER — INSULIN ASPART 100 UNIT/ML ~~LOC~~ SOLN
0.0000 [IU] | Freq: Three times a day (TID) | SUBCUTANEOUS | Status: DC
  Administered 2013-05-18: 7 [IU] via SUBCUTANEOUS
  Administered 2013-05-18: 4 [IU] via SUBCUTANEOUS
  Administered 2013-05-18: 3 [IU] via SUBCUTANEOUS
  Administered 2013-05-19: 4 [IU] via SUBCUTANEOUS

## 2013-05-18 NOTE — Progress Notes (Signed)
[facility] PROGRESS NOTE  Bradley Mason EAV:[id number] DOB: May 21, 1966 DOA: 05/17/2013 PCP: No PCP Per Patient  Assessment/Plan: #1 DKA Likely secondary to medical  Noncompliance as patient recently moved from [place] does not have a PCP around of his medications approximately 2 weeks ago.urinalysis is negative. Chest x-ray is negative. Patient denies any chest pain or shortness of breath.first set of troponin was negative. Patient currently of glucose stabilizer. CBG currently 248. Discontinue D5 half-normal saline. Continue Lantus 60 units daily. Discontinue meal coverage insulin as patient not eating and nauseous. Continue sliding scale insulin. Check a hemoglobin A1c.place on clear liquids. Consult with the diabetic coordinator. Follow.  #2 hypotension Secondary to volume depletion. Blood pressure has responded to IV fluids. Antihypertensive medications on hold. Follow.  #3 nausea/ vomiting/ diarrhea Likely secondary to a gastroenteritis. Patient would 1 loose stool this morning. Patient with some nausea and episode of vomiting. Will discontinue current diet and place on clear liquids. Continue IV fluids. Stool studies pending.Supportive care.  #4 hypokalemia Replete.  #5 hyponatremia Secondary to hyperglycemia and dehydration. Follow.  #6 tobacco abuse Tobacco cessation.  #7 prophylaxis PPI for GI prophylaxis.Lovenox for DVT prophylaxis.  Code Status: full Family Communication: updated patient no family at bedside. Disposition Plan: home when medically stable   Consultants:  none  Procedures:  Chest x-ray 05/17/2013  Antibiotics:  none  HPI/Subjective: Patient states he feels bad. Patient with nausea. Patient states had an episode of emesis this morning around 3 AM. Patient also states had loose stool.  Objective: Filed Vitals:   05/18/13 0800  BP: 131/89  Pulse: 94  Temp: 97.3 F (36.3 C)  Resp: 17    Intake/Output Summary (Last 24 hours) at  05/18/13 0902 Last data filed at 05/18/13 0600  Gross per 24 hour  Intake 3805.42 ml  Output    400 ml  Net 3405.42 ml   Filed Weights   05/17/13 2000  Weight: 108.1 kg (238 lb 5.1 oz)    Exam:   General:  NAD  Cardiovascular: RRR  Respiratory: CTAB anterior lung fields  Abdomen: soft, nontender, nondistended, positive bowel sounds.  Musculoskeletal: no clubbing cyanosis or edema  Data Reviewed: Basic Metabolic Panel:  Recent Labs Lab 05/17/13 1456 05/17/13 2222 05/18/13 0042 05/18/13 0320  NA 129* 133* 136 131*  K 4.0 3.6 3.2* 3.3*  CL 95* 101 104 100  CO2 17* 20 20 20   GLUCOSE 386* 218* 215* 242*  BUN 13 10 8 8   CREATININE 0.90 0.65 0.64 0.61  CALCIUM 8.8 8.2* 8.1* 7.9*   Liver Function Tests:  Recent Labs Lab 05/17/13 1456  AST 14  ALT 14  ALKPHOS 92  BILITOT 0.3  PROT 7.1  ALBUMIN 3.4*   No results found for this basename: LIPASE, AMYLASE,  in the last 168 hours No results found for this basename: AMMONIA,  in the last 168 hours CBC:  Recent Labs Lab 05/17/13 1509 05/17/13 2222 05/18/13 0320  WBC 10.6* 7.8 6.8  NEUTROABS 8.5*  --  4.2  HGB 15.9 14.6 14.3  HCT 45.1 42.4 42.0  MCV 77.5* 77.2* 77.6*  PLT 221 231 214   Cardiac Enzymes:  Recent Labs Lab 05/17/13 1842  TROPONINI <0.30   BNP (last 3 results) No results found for this basename: PROBNP,  in the last 8760 hours CBG:  Recent Labs Lab 05/17/13 2216 05/17/13 2325 05/18/13 0035 05/18/13 0425 05/18/13 0831  GLUCAP 212* 248* 191* 283* 248*    Recent Results (from the past 240  hour(s))  MRSA PCR SCREENING     Status: None   Collection Time    05/17/13  7:57 PM      Result Value Range Status   MRSA by PCR NEGATIVE  NEGATIVE Final   Comment:            The GeneXpert MRSA Assay (FDA     approved for NASAL specimens     only), is one component of a     comprehensive MRSA colonization     surveillance program. It is not     intended to diagnose MRSA     infection  nor to guide or     monitor treatment for     MRSA infections.     Studies: Dg Chest Port 1 View  05/17/2013   CLINICAL DATA:  Chest pain.  EXAM: PORTABLE CHEST - 1 VIEW  COMPARISON:  None.  FINDINGS: Low volume chest. Cardiopericardial silhouette appears within normal limits allowing for volumes. Monitoring leads project over the chest. No focal consolidation. No pleural effusion is identified.  IMPRESSION: Low volume chest.   Electronically Signed   By: Andreas Newport M.D.   On: 05/17/2013 18:01    Scheduled Meds: . enoxaparin (LOVENOX) injection  40 mg Subcutaneous Q24H  . insulin aspart  0-20 Units Subcutaneous TID WC  . insulin aspart  0-5 Units Subcutaneous QHS  . insulin glargine  60 Units Subcutaneous QHS   Continuous Infusions: . sodium chloride      Principal Problem:   DKA (diabetic ketoacidoses) Active Problems:   Diarrhea   HTN (hypertension)   Tobacco abuse   Hypokalemia   Nausea with vomiting   Hyponatremia    Time spent: 35 mins    [facility] MD [facility] Pager [phone]. If 7PM-7AM, please contact night-coverage at [email or web address], password [facility] 05/18/2013, 9:02 AM  LOS: 1 day

## 2013-05-19 DIAGNOSIS — E871 Hypo-osmolality and hyponatremia: Secondary | ICD-10-CM

## 2013-05-19 LAB — CBC
HCT: 43.3 % (ref 39.0–52.0)
MCV: 76.9 fL — ABNORMAL LOW (ref 78.0–100.0)
Platelets: 238 10*3/uL (ref 150–400)
RBC: 5.63 MIL/uL (ref 4.22–5.81)
WBC: 8.7 10*3/uL (ref 4.0–10.5)

## 2013-05-19 LAB — GLUCOSE, CAPILLARY
Glucose-Capillary: 202 mg/dL — ABNORMAL HIGH (ref 70–99)
Glucose-Capillary: 166 mg/dL — ABNORMAL HIGH (ref 70–99)
Glucose-Capillary: 167 mg/dL — ABNORMAL HIGH (ref 70–99)

## 2013-05-19 LAB — BASIC METABOLIC PANEL
CO2: 20 mEq/L (ref 19–32)
Calcium: 8.3 mg/dL — ABNORMAL LOW (ref 8.4–10.5)
Chloride: 102 mEq/L (ref 96–112)
GFR calc Af Amer: >90 mL/min (ref >90)
Potassium: 3.7 mEq/L (ref 3.5–5.1)
Sodium: 133 mEq/L — ABNORMAL LOW (ref 135–145)

## 2013-05-19 MED ORDER — INSULIN ASPART 100 UNIT/ML ~~LOC~~ SOLN: 15.0000 [IU] | Freq: Three times a day (TID) | SUBCUTANEOUS | Status: DC

## 2013-05-19 MED ORDER — VERAPAMIL HCL 80 MG PO TABS: 80.0000 mg | ORAL_TABLET | Freq: Two times a day (BID) | ORAL | Status: DC

## 2013-05-19 MED ORDER — LISINOPRIL 20 MG PO TABS: 20.0000 mg | ORAL_TABLET | Freq: Every day | ORAL | Status: DC

## 2013-05-19 MED ORDER — INSULIN GLARGINE 100 UNIT/ML ~~LOC~~ SOLN: 60.0000 [IU] | Freq: Every day | SUBCUTANEOUS | Status: DC

## 2013-05-19 MED ORDER — HYDROCHLOROTHIAZIDE 12.5 MG PO TABS: 12.5000 mg | ORAL_TABLET | Freq: Every day | ORAL | Status: DC

## 2013-05-19 NOTE — Progress Notes (Signed)
   CARE MANAGEMENT NOTE 05/19/2013  Patient:  Bradley Mason, Bradley Mason   Account Number:  [id number]  Date Initiated:  05/19/2013  Documentation initiated by:  [facility]  Subjective/Objective Assessment:   adm: Principal Problem:    DKA (diabetic ketoacidoses)  Active Problems:    Diarrhea    HTN (hypertension)    Tobacco abuse     Action/Plan:   discharge planning   Anticipated DC Date:  05/19/2013   Anticipated DC Plan:  HOME/SELF CARE      DC Planning Services  CM consult  PCP issues      Choice offered to / List presented to:             Status of service:  Completed, signed off Medicare Important Message given?   (If response is "NO", the following Medicare IM given date fields will be blank) Date Medicare IM given:   Date Additional Medicare IM given:    Discharge Disposition:  HOME/SELF CARE  Per UR Regulation:    If discussed at Long Length of Stay Meetings, dates discussed:    Comments:  05/19/13 10:58 CM spoke with pt in room concerning his lack of PCP.  CM gave List of Resources to call for PCP and per MD instruction, gave [facility] handout with instruction to call Monday for appt.  Pt verbalized understanding.  No other CM needs were communicated.  Freddy Jaksch, BSN, CM [phone].

## 2013-05-19 NOTE — Discharge Summary (Signed)
Physician Discharge Summary  Bradley Mason YNW:[id number] DOB: 01-Nov-1965 DOA: 05/17/2013  PCP: No PCP Per Patient  Admit date: 05/17/2013 Discharge date: 05/19/2013  Time spent: >30 minutes  Recommendations for Outpatient Follow-up:  Follow-up Information   Please follow up. (PCP next week, Case manager to assist with PCP)       Discharge Diagnoses:  Principal Problem:   DKA (diabetic ketoacidoses) Active Problems:   Diarrhea   HTN (hypertension)   Tobacco abuse   Hypokalemia   Nausea with vomiting   Hyponatremia   Discharge Condition: Improved/stable  Diet recommendation: Modified carbohydrate  Filed Weights   05/17/13 2000 05/19/13 0625  Weight: 108.1 kg (238 lb 5.1 oz) 110.5 kg (243 lb 9.7 oz)    History of present illness:  Bradley Mason is a 47 y.o. male with history of diabetes mellitus hypertension and ongoing tobacco abuse had originally come to the ER to get medication refill when while waiting patient became weak and almost passed out. Patient was brought in to the ER and was found to be hypotensive with blood pressure in the 70s systolic. Patient was given 2 L normal saline bolus after which patient's blood pressure has improved with systolic more than 100 and heart rate has come down into the 60s. Patient denies any chest pain shortness of breath nausea vomiting. Patient did have some abdominal cramps with one episode of diarrhea after patient was admitted. Patient presently denies abdominal pain fever chills. Denies having used any recent antibiotics. Labs revealed elevated anion gap consistent with possible DKA. Patient states he has not been taking his medications for last 2 weeks because he ran out of it. Patient will be admitted for further management. Patient has been started on IV insulin infusion and IV fluids. He was admitted for further evaluation and management.   Hospital Course:  #1 DKA  Likely secondary to medical Noncompliance as patient recently  moved from [place] does not have a PCP around of his medications approximately 2 weeks ago.as discussed above, upon admission urinalysis is negative. Chest x-ray is negative. Patient denied any chest pain or shortness of breath.first set of troponin was negative. Patient was placed on IV insulin via glucose stabilizer and his Accu-Cheks were closely monitor it and when he met criteria he was transitioned to Lantus. On followup today his sugars are better controlled-136 202 in the past 24 hours, and he is tolerating by mouth well.Will Continue Lantus 60 units daily and his meal coverage insulin upon discharge.  hemoglobin A1c came back at 13.6, his to follow closely with her PCP-case manager to assist with establishing PCP here in [place]. #2 hypotension  Resolved, Secondary to volume depletion. Blood pressure has responded to IV fluids. -He isTo continue his outpatient medications upon discharge.   #3 nausea/ vomiting/ diarrhea  Resolved, Likely secondary to a gastroenteritis. #4 hypokalemia  k Repleted.  #5 hyponatremia  Secondary to hyperglycemia and dehydration.   #6 tobacco abuse  Patient receivedTobacco cessation counseling.   Procedures:  none  Consultations:  none  Discharge Exam: Filed Vitals:   05/19/13 0811  BP:   Pulse:   Temp:   Resp: 26   Exam:  General: NAD  Cardiovascular: RRR  Respiratory: CTAB anterior lung fields  Abdomen: soft, nontender, nondistended, positive bowel sounds.  Musculoskeletal: no clubbing cyanosis or edema    Discharge Instructions  Discharge Orders   Future Orders Complete By Expires   Diet Carb Modified  As directed    Increase activity slowly  As directed        Medication List         hydrochlorothiazide 12.5 MG tablet  Commonly known as:  HYDRODIURIL  Take 1 tablet (12.5 mg total) by mouth daily. Take in the AM     insulin aspart 100 UNIT/ML injection  Commonly known as:  novoLOG  Inject 15 Units into the skin 3  (three) times daily with meals.     insulin glargine 100 UNIT/ML injection  Commonly known as:  LANTUS  Inject 0.6 mLs (60 Units total) into the skin at bedtime.     lisinopril 20 MG tablet  Commonly known as:  PRINIVIL,ZESTRIL  Take 1 tablet (20 mg total) by mouth daily.     meclizine 25 MG tablet  Commonly known as:  ANTIVERT  Take 1 tablet (25 mg total) by mouth 3 (three) times daily as needed for dizziness.     verapamil 80 MG tablet  Commonly known as:  CALAN  Take 1 tablet (80 mg total) by mouth 2 (two) times daily.       No Known Allergies    The results of significant diagnostics from this hospitalization (including imaging, microbiology, ancillary and laboratory) are listed below for reference.    Significant Diagnostic Studies: Dg Chest Port 1 View  05/17/2013   CLINICAL DATA:  Chest pain.  EXAM: PORTABLE CHEST - 1 VIEW  COMPARISON:  None.  FINDINGS: Low volume chest. Cardiopericardial silhouette appears within normal limits allowing for volumes. Monitoring leads project over the chest. No focal consolidation. No pleural effusion is identified.  IMPRESSION: Low volume chest.   Electronically Signed   By: Andreas Newport M.D.   On: 05/17/2013 18:01    Microbiology: Recent Results (from the past 240 hour(s))  MRSA PCR SCREENING     Status: None   Collection Time    05/17/13  7:57 PM      Result Value Range Status   MRSA by PCR NEGATIVE  NEGATIVE Final   Comment:            The GeneXpert MRSA Assay (FDA     approved for NASAL specimens     only), is one component of a     comprehensive MRSA colonization     surveillance program. It is not     intended to diagnose MRSA     infection nor to guide or     monitor treatment for     MRSA infections.     Labs: Basic Metabolic Panel:  Recent Labs Lab 05/17/13 2222 05/18/13 0042 05/18/13 0320 05/18/13 0945 05/19/13 0400  NA 133* 136 131* 130* 133*  K 3.6 3.2* 3.3* 4.5 3.7  CL 101 104 100 101 102  CO2 20  20 20 19 20   GLUCOSE 218* 215* 242* 275* 162*  BUN 10 8 8 7  5*  CREATININE 0.65 0.64 0.61 0.58 0.61  CALCIUM 8.2* 8.1* 7.9* 8.0* 8.3*   Liver Function Tests:  Recent Labs Lab 05/17/13 1456  AST 14  ALT 14  ALKPHOS 92  BILITOT 0.3  PROT 7.1  ALBUMIN 3.4*   No results found for this basename: LIPASE, AMYLASE,  in the last 168 hours No results found for this basename: AMMONIA,  in the last 168 hours CBC:  Recent Labs Lab 05/17/13 1509 05/17/13 2222 05/18/13 0320 05/19/13 0400  WBC 10.6* 7.8 6.8 8.7  NEUTROABS 8.5*  --  4.2  --   HGB 15.9 14.6 14.3 15.0  HCT 45.1  42.4 42.0 43.3  MCV 77.5* 77.2* 77.6* 76.9*  PLT 221 231 214 238   Cardiac Enzymes:  Recent Labs Lab 05/17/13 1842  TROPONINI <0.30   BNP: BNP (last 3 results) No results found for this basename: PROBNP,  in the last 8760 hours CBG:  Recent Labs Lab 05/18/13 1631 05/18/13 2021 05/19/13 0048 05/19/13 0349 05/19/13 0808  GLUCAP 187* 189* 202* 166* 167*       Signed:  Gracee Ratterree C  [facility] 05/19/2013, 10:54 AM

## 2013-05-19 NOTE — ED Provider Notes (Signed)
I saw and evaluated the patient, reviewed the resident's note and I agree with the findings and plan.  EKG Interpretation    Date/Time:  Friday May 17 2013 19:22:28 EST Ventricular Rate:  108 PR Interval:  133 QRS Duration: 94 QT Interval:  357 QTC Calculation: 478 R Axis:   67 Text Interpretation:  Sinus tachycardia Ventricular premature complex Probable left atrial enlargement Borderline T abnormalities, diffuse leads Borderline prolonged QT interval ED PHYSICIAN INTERPRETATION AVAILABLE IN [facility] Confirmed by TEST, RECORD ([id number]) on 05/19/2013 8:24:28 AM            CRITICAL CARE Performed by: Lear Ng Total critical care time: 35 min Critical care time was exclusive of separately billable procedures and treating other patients. Critical care was necessary to treat or prevent imminent or life-threatening deterioration. Critical care was time spent personally by me on the following activities: development of treatment plan with patient and/or surrogate as well as nursing, discussions with consultants, evaluation of patient's response to treatment, examination of patient, obtaining history from patient or surrogate, ordering and performing treatments and interventions, ordering and review of laboratory studies, ordering and review of radiographic studies, pulse oximetry and re-evaluation of patient's condition.    Pt was here for refills of medication, no PCP by his report, had been non compliant, endorses urinary frequency, polydypsia, fatigue and weakness, generalized.  Has had intermittent nausea, no vomiting.  Pt had been admitted for DKA in the past but didn't recall exactly when.  No fevers, cough or cold symptoms.  In triage, pt noted to slump over, was found partially responsive due to hypotension.  Pt improved with IVF boluses, has low bicarb on labs, anion gap of 17 initially and clinically dehydrated.  Pt witih early DKA and will treat with IVF's, IV  insulin and will need admission.  Hypotension felt to be transient due to hypovolemia.  Bradley Mason. Oletta Lamas, MD 05/19/13 1217

## 2013-06-21 ENCOUNTER — Emergency Department (HOSPITAL_COMMUNITY)
Admission: EM | Admit: 2013-06-21 | Discharge: 2013-06-21 | Disposition: A | Discharge status: Home / Self Care | Payer: Medicare Other | Attending: Emergency Medicine | Admitting: Emergency Medicine

## 2013-06-21 ENCOUNTER — Encounter (HOSPITAL_COMMUNITY): Payer: Self-pay | Admitting: Emergency Medicine

## 2013-06-21 DIAGNOSIS — R040 Epistaxis: Secondary | ICD-10-CM | POA: Insufficient documentation

## 2013-06-21 DIAGNOSIS — F17200 Nicotine dependence, unspecified, uncomplicated: Secondary | ICD-10-CM | POA: Insufficient documentation

## 2013-06-21 DIAGNOSIS — I10 Essential (primary) hypertension: Secondary | ICD-10-CM | POA: Insufficient documentation

## 2013-06-21 DIAGNOSIS — E119 Type 2 diabetes mellitus without complications: Secondary | ICD-10-CM | POA: Insufficient documentation

## 2013-06-21 DIAGNOSIS — R51 Headache: Secondary | ICD-10-CM | POA: Insufficient documentation

## 2013-06-21 DIAGNOSIS — Z794 Long term (current) use of insulin: Secondary | ICD-10-CM | POA: Insufficient documentation

## 2013-06-21 DIAGNOSIS — Z79899 Other long term (current) drug therapy: Secondary | ICD-10-CM | POA: Insufficient documentation

## 2013-06-21 NOTE — ED Notes (Signed)
Miller, MD at bedside.  

## 2013-06-21 NOTE — ED Provider Notes (Signed)
CSN: [id number]     Arrival date & time 06/21/13  4098 History   First MD Initiated Contact with Patient 06/21/13 0335     Chief Complaint  Patient presents with  . Epistaxis  . Headache   (Consider location/radiation/quality/duration/timing/severity/associated sxs/prior Treatment) HPI Comments: 48 year old male, history of diabetes and hypertension who presents with acute onset of nosebleed which occurred just prior to arrival. The bleeding has been intermittent, nothing makes it better or worse, he denies any sneezing though he has been coughing a lot related to an upper respiratory infection. He does not pick his nose, has had no injuries, denies using any anticoagulants including aspirin. He does not get frequent nosebleeds, he has no other sources of bleeding  Patient is a 48 y.o. male presenting with nosebleeds and headaches. The history is provided by the patient and a relative.  Epistaxis Associated symptoms: headaches   Headache   Past Medical History  Diagnosis Date  . Diabetes mellitus without complication   . Hypertension    Past Surgical History  Procedure Laterality Date  . No past surgeries     Family History  Problem Relation Age of Onset  . Diabetes Mellitus II Mother   . Diabetes Mellitus II Father   . Diabetes Mellitus II Brother   . Stroke Brother    History  Substance Use Topics  . Smoking status: Current Every Day Smoker    Types: Cigarettes  . Smokeless tobacco: Not on file  . Alcohol Use: No    Review of Systems  HENT: Positive for nosebleeds.   Neurological: Positive for headaches.    Allergies  Review of patient's allergies indicates no known allergies.  Home Medications   Current Outpatient Rx  Name  Route  Sig  Dispense  Refill  . hydrochlorothiazide (HYDRODIURIL) 12.5 MG tablet   Oral   Take 1 tablet (12.5 mg total) by mouth daily. Take in the AM   30 tablet   1   . insulin aspart (NOVOLOG) 100 UNIT/ML injection   Subcutaneous   Inject 15 Units into the skin 3 (three) times daily with meals.   1 vial   0   . insulin glargine (LANTUS) 100 UNIT/ML injection   Subcutaneous   Inject 0.6 mLs (60 Units total) into the skin at bedtime.   10 mL   0   . lisinopril (PRINIVIL,ZESTRIL) 20 MG tablet   Oral   Take 1 tablet (20 mg total) by mouth daily.   30 tablet   0   . meclizine (ANTIVERT) 25 MG tablet   Oral   Take 1 tablet (25 mg total) by mouth 3 (three) times daily as needed for dizziness.   12 tablet   0   . verapamil (CALAN) 80 MG tablet   Oral   Take 1 tablet (80 mg total) by mouth 2 (two) times daily.   30 tablet   0    BP 124/88  Pulse 96  Temp(Src) 98.1 F (36.7 C) (Oral)  Resp 20  SpO2 97% Physical Exam  Constitutional: He appears well-developed and well-nourished. No distress.  HENT:  Oropharynx clear, nasal pharynx with small areas of blood in the right nostril, no active bleeding, left nostril normal  Eyes: Conjunctivae are normal. Right eye exhibits no discharge. Left eye exhibits no discharge. No scleral icterus.  Neck: Normal range of motion. Neck supple.  Cardiovascular: Normal rate, normal heart sounds and intact distal pulses.   Pulmonary/Chest: Effort normal and breath sounds normal.  No respiratory distress. He has no wheezes. He has no rales.  Abdominal: Soft. There is no tenderness.  Musculoskeletal: Normal range of motion. He exhibits no edema.  Lymphadenopathy:    He has no cervical adenopathy.  Neurological: He is alert. Coordination normal.  Skin: Skin is warm and dry. No rash noted. He is not diaphoretic.    ED Course  Procedures (including critical care time) Labs Review Labs Reviewed - No data to display Imaging Review No results found.  EKG Interpretation   None       MDM   1. Right-sided epistaxis    The patient is overall well-appearing, there is no active bleeding,  Epistaxis management:  Patient placed in a semi-fowler position, topical 1% with  epinephrine lidocaine placed on cotton ball within the naris on the right, blood removed, no bleeding vessels seen on removal, no further bleeding after Neo-Synephrine applied intranasally.  Patient tolerated procedure without difficulty or complications  Patient stable for discharge  Vida RollerBrian D Zachary Lovins, MD 06/21/13 [phone]

## 2013-06-21 NOTE — Discharge Instructions (Signed)
Use the nasal inhalation medication that I have given you if you develop recurrent bleeding-  Read the attached instructions regarding nose bleeds.  Return if symptoms worsen.   Please call your doctor for a followup appointment within 24-48 hours. When you talk to your doctor please let them know that you were seen in the emergency department and have them acquire all of your records so that they can discuss the findings with you and formulate a treatment plan to fully care for your new and ongoing problems.

## 2013-06-21 NOTE — ED Notes (Addendum)
Pt in c/o headache that he woke up with and intermittent nosebleed since that time, no history of same, states he doesn't normally get headaches, rates this 10/10, denies any other symptoms with this, no active bleeding at this time

## 2013-07-10 ENCOUNTER — Encounter (HOSPITAL_COMMUNITY): Payer: Self-pay | Admitting: Emergency Medicine

## 2013-07-10 ENCOUNTER — Emergency Department (HOSPITAL_COMMUNITY)
Admission: EM | Admit: 2013-07-10 | Discharge: 2013-07-11 | Disposition: A | Discharge status: Home / Self Care | Payer: Medicare Other | Attending: Emergency Medicine | Admitting: Emergency Medicine

## 2013-07-10 DIAGNOSIS — Z79899 Other long term (current) drug therapy: Secondary | ICD-10-CM | POA: Insufficient documentation

## 2013-07-10 DIAGNOSIS — IMO0001 Reserved for inherently not codable concepts without codable children: Secondary | ICD-10-CM | POA: Insufficient documentation

## 2013-07-10 DIAGNOSIS — F17200 Nicotine dependence, unspecified, uncomplicated: Secondary | ICD-10-CM | POA: Insufficient documentation

## 2013-07-10 DIAGNOSIS — L03319 Cellulitis of trunk, unspecified: Principal | ICD-10-CM

## 2013-07-10 DIAGNOSIS — I10 Essential (primary) hypertension: Secondary | ICD-10-CM | POA: Insufficient documentation

## 2013-07-10 DIAGNOSIS — L02219 Cutaneous abscess of trunk, unspecified: Secondary | ICD-10-CM | POA: Insufficient documentation

## 2013-07-10 DIAGNOSIS — E1165 Type 2 diabetes mellitus with hyperglycemia: Secondary | ICD-10-CM

## 2013-07-10 DIAGNOSIS — L02214 Cutaneous abscess of groin: Secondary | ICD-10-CM

## 2013-07-10 DIAGNOSIS — Z794 Long term (current) use of insulin: Secondary | ICD-10-CM | POA: Insufficient documentation

## 2013-07-10 LAB — GLUCOSE, CAPILLARY: GLUCOSE-CAPILLARY: 304 mg/dL — AB (ref 70–99)

## 2013-07-10 MED ORDER — IOHEXOL 300 MG/ML  SOLN
25.0000 mL | Freq: Once | INTRAMUSCULAR | Status: AC | PRN
  Administered 2013-07-10: 25 mL via ORAL

## 2013-07-10 MED ORDER — ONDANSETRON HCL 4 MG/2ML IJ SOLN
4.0000 mg | Freq: Once | INTRAMUSCULAR | Status: AC
  Administered 2013-07-10: 4 mg via INTRAVENOUS
  Filled 2013-07-10: qty 2

## 2013-07-10 MED ORDER — HYDROMORPHONE HCL PF 1 MG/ML IJ SOLN
1.0000 mg | Freq: Once | INTRAMUSCULAR | Status: AC
  Administered 2013-07-10: 1 mg via INTRAVENOUS
  Filled 2013-07-10: qty 1

## 2013-07-10 NOTE — ED Notes (Signed)
Family member at desk stating pt is upset and wanting to leave. This Rn informed family member that there was 1 provider in pod and provider was moving as fast as possible.  Family member stating "well he may not be in there much longer".

## 2013-07-10 NOTE — ED Notes (Signed)
Reports abscess to testicle x 2 days, no drainage but reports it is about to start draining .

## 2013-07-10 NOTE — ED Provider Notes (Signed)
CSN: [id number]     Arrival date & time 07/10/13  1858 History  This chart was scribed for non-physician practitioner, Arthor Captain, PA-C working with Merrie Roof, by Luisa Dago, ED scribe. This patient was seen in room TR09C/TR09C and the patient's care was started at 9:38 PM.    Chief Complaint  Patient presents with  . Abscess    The history is provided by the patient. No language interpreter was used.   HPI Comments: Bradley Mason is a 48 y.o. male who presents to the Emergency Department complaining of an worsening abscess in his groin that started 2 days ago. Pt has a history of DM. Wife states that he gets abscess every 4 months. She states that they just recently moved to [address] [facility] from [place] so he does not have a PCP. They are still in the process of looking for one. Pt is also complaining of associated worsening pain. He denies any fever, chills, diaphoresis, cough, chest pain, or SOB.   Past Medical History  Diagnosis Date  . Diabetes mellitus without complication   . Hypertension    Past Surgical History  Procedure Laterality Date  . No past surgeries     Family History  Problem Relation Age of Onset  . Diabetes Mellitus II Mother   . Diabetes Mellitus II Father   . Diabetes Mellitus II Brother   . Stroke Brother    History  Substance Use Topics  . Smoking status: Current Every Day Smoker    Types: Cigarettes  . Smokeless tobacco: Not on file  . Alcohol Use: No    Review of Systems  Constitutional: Negative for fever, chills and diaphoresis.  Respiratory: Negative for cough and shortness of breath.   Cardiovascular: Negative for chest pain.  Skin: Positive for wound (abscess).    Allergies  Review of patient's allergies indicates no known allergies.  Home Medications   Current Outpatient Rx  Name  Route  Sig  Dispense  Refill  . hydrochlorothiazide (HYDRODIURIL) 12.5 MG tablet   Oral   Take 1 tablet (12.5 mg total) by mouth daily.  Take in the AM   30 tablet   1   . insulin aspart (NOVOLOG) 100 UNIT/ML injection   Subcutaneous   Inject 15 Units into the skin 3 (three) times daily with meals.   1 vial   0   . insulin glargine (LANTUS) 100 UNIT/ML injection   Subcutaneous   Inject 0.6 mLs (60 Units total) into the skin at bedtime.   10 mL   0   . lisinopril (PRINIVIL,ZESTRIL) 20 MG tablet   Oral   Take 1 tablet (20 mg total) by mouth daily.   30 tablet   0   . meclizine (ANTIVERT) 25 MG tablet   Oral   Take 1 tablet (25 mg total) by mouth 3 (three) times daily as needed for dizziness.   12 tablet   0   . verapamil (CALAN) 80 MG tablet   Oral   Take 1 tablet (80 mg total) by mouth 2 (two) times daily.   30 tablet   0     Triage vitals: BP 165/105  Pulse 110  Temp(Src) 97.7 F (36.5 C)  Resp 19  Wt 250 lb (113.399 kg)  SpO2 94%  Physical Exam  Nursing note and vitals reviewed. Constitutional: He is oriented to person, place, and time. He appears well-developed and well-nourished. No distress.  HENT:  Head: Normocephalic and atraumatic.  Eyes:  EOM are normal.  Neck: Neck supple. No tracheal deviation present.  Cardiovascular: Normal rate.   Pulmonary/Chest: Effort normal. No respiratory distress.  Musculoskeletal: Normal range of motion.  Neurological: He is alert and oriented to person, place, and time.  Skin: Skin is warm and dry.  4 cm Fluctuant boil of R groin. 3 cm surrounding induration. No testicular tenderness. No discharge from penis  Psychiatric: He has a normal mood and affect. His behavior is normal.    ED Course  Procedures (including critical care time)  DIAGNOSTIC STUDIES: Oxygen Saturation is 94% on RA, low by my interpretation.    COORDINATION OF CARE:    Labs Review Labs Reviewed - No data to display Imaging Review No results found.  EKG Interpretation   None      INCISION AND DRAINAGE Performed by: Arthor CaptainHarris, Saburo Luger Consent: Verbal consent  obtained. Risks and benefits: risks, benefits and alternatives were discussed Type: abscess  Body area: r groin  Anesthesia: local infiltration  Incision was made with a scalpel.  Local anesthetic: lidocaine 2% w epinephrine  Anesthetic total: 6 ml  Complexity: complex Blunt dissection to break up loculations  Drainage: purulent  Drainage amount: Copious  Packing material: 1/4 in iodoform gauze  Patient tolerance: Patient tolerated the procedure well with no immediate complications.     MDM   Final diagnoses:  Abscess of groin, right    Patient seen in shared visit with Dr. Loretha StaplerWofford.  Large groin abscess in uncontrolled diabetic. We have concern for possible Fournier's gangrene. I have discussed this with the patient and will proceed with contrast CT of pelvis pending creatinine. Pain control initiated.  Hyperglycemia. Normal creatinine.  Patient without gas gangrene. I and d performed. Patient  D/c with bactrim and keflex. He is to follow up here in 2 days for a wound recheck. referal given to community health and wellness.  I personally performed the services described in this documentation, which was scribed in my presence. The recorded information has been reviewed and is accurate.      Arthor CaptainAbigail Kirsi Hugh, PA-C 07/15/13 2019

## 2013-07-11 ENCOUNTER — Encounter (HOSPITAL_COMMUNITY): Payer: Self-pay | Admitting: Radiology

## 2013-07-11 ENCOUNTER — Emergency Department (HOSPITAL_COMMUNITY): Payer: Medicare Other

## 2013-07-11 LAB — POCT I-STAT, CHEM 8
BUN: 6 mg/dL (ref 6–23)
CALCIUM ION: 1.2 mmol/L (ref 1.12–1.23)
CHLORIDE: 99 meq/L (ref 96–112)
Creatinine, Ser: 0.6 mg/dL (ref 0.50–1.35)
Glucose, Bld: 343 mg/dL — ABNORMAL HIGH (ref 70–99)
HCT: 45 % (ref 39.0–52.0)
Hemoglobin: 15.3 g/dL (ref 13.0–17.0)
Potassium: 3.7 mEq/L (ref 3.7–5.3)
Sodium: 136 mEq/L — ABNORMAL LOW (ref 137–147)
TCO2: 24 mmol/L (ref 0–100)

## 2013-07-11 MED ORDER — CEPHALEXIN 500 MG PO CAPS: 500.0000 mg | ORAL_CAPSULE | Freq: Four times a day (QID) | ORAL | Status: DC

## 2013-07-11 MED ORDER — IOHEXOL 300 MG/ML  SOLN
100.0000 mL | Freq: Once | INTRAMUSCULAR | Status: AC | PRN
  Administered 2013-07-11: 100 mL via INTRAVENOUS

## 2013-07-11 MED ORDER — SODIUM CHLORIDE 0.9 % IV BOLUS (SEPSIS): 1000.0000 mL | Freq: Once | INTRAVENOUS | Status: DC

## 2013-07-11 MED ORDER — HYDROCODONE-ACETAMINOPHEN 5-325 MG PO TABS: 1.0000 | ORAL_TABLET | Freq: Four times a day (QID) | ORAL | Status: DC | PRN

## 2013-07-11 MED ORDER — SULFAMETHOXAZOLE-TMP DS 800-160 MG PO TABS: 1.0000 | ORAL_TABLET | Freq: Two times a day (BID) | ORAL | Status: DC

## 2013-07-11 NOTE — Discharge Instructions (Signed)
Followup with your doctor or an urgent care in order to remove your packing in 48-72 hours. You may return to the emergency department if you have  a fever that persists greater than 101 or your abscess appears to become infected (growing surrounding redness and warmth). Do not operate any heavy machinery while on pain medications. Do not consume alcohol on these medications either.  Abscess An abscess (boil or furuncle) is an infected area that contains a collection of pus.  SYMPTOMS Signs and symptoms of an abscess include pain, tenderness, redness, or hardness. You may feel a moveable soft area under your skin. An abscess can occur anywhere in the body.  TREATMENT  A surgical cut (incision) may be made over your abscess to drain the pus. Gauze may be packed into the space or a drain may be looped through the abscess cavity (pocket). This provides a drain that will allow the cavity to heal from the inside outwards. The abscess may be painful for a few days, but should feel much better if it was drained.  Your abscess, if seen early, may not have localized and may not have been drained. If not, another appointment may be required if it does not get better on its own or with medications. HOME CARE INSTRUCTIONS   Only take over-the-counter or prescription medicines for pain, discomfort, or fever as directed by your caregiver.   Take your antibiotics as directed if they were prescribed. Finish them even if you start to feel better.   Keep the skin and clothes clean around your abscess.   If the abscess was drained, you will need to use gauze dressing to collect any draining pus. Dressings will typically need to be changed 3 or more times a day.   The infection may spread by skin contact with others. Avoid skin contact as much as possible.   Practice good hygiene. This includes regular hand washing, cover any draining skin lesions, and do not share personal care items.   If you participate in  sports, do not share athletic equipment, towels, whirlpools, or personal care items. Shower after every practice or tournament.   If a draining area cannot be adequately covered:   Do not participate in sports.   Children should not participate in day care until the wound has healed or drainage stops.   If your caregiver has given you a follow-up appointment, it is very important to keep that appointment. Not keeping the appointment could result in a much worse infection, chronic or permanent injury, pain, and disability. If there is any problem keeping the appointment, you must call back to this facility for assistance.  SEEK MEDICAL CARE IF:   You develop increased pain, swelling, redness, drainage, or bleeding in the wound site.   You develop signs of generalized infection including muscle aches, chills, fever, or a general ill feeling.   You have an oral temperature above 102 F (38.9 C).  MAKE SURE YOU:   Understand these instructions.   Will watch your condition.   Will get help right away if you are not doing well or get worse.  Document Released: 02/16/2005 Document Revised: 01/19/2011 Document Reviewed: 12/11/2007 [facility] Patient Information 2012 [employer], [place].   Abscess An abscess is an infected area that contains a collection of pus and debris.It can occur in almost any part of the body. An abscess is also known as a furuncle or boil. CAUSES  An abscess occurs when tissue gets infected. This can occur from  blockage of oil or sweat glands, infection of hair follicles, or a minor injury to the skin. As the body tries to fight the infection, pus collects in the area and creates pressure under the skin. This pressure causes pain. People with weakened immune systems have difficulty fighting infections and get certain abscesses more often.  SYMPTOMS Usually an abscess develops on the skin and becomes a painful mass that is red, warm, and tender. If the abscess forms under  the skin, you may feel a moveable soft area under the skin. Some abscesses break open (rupture) on their own, but most will continue to get worse without care. The infection can spread deeper into the body and eventually into the bloodstream, causing you to feel ill.  DIAGNOSIS  Your caregiver will take your medical history and perform a physical exam. A sample of fluid may also be taken from the abscess to determine what is causing your infection. TREATMENT  Your caregiver may prescribe antibiotic medicines to fight the infection. However, taking antibiotics alone usually does not cure an abscess. Your caregiver may need to make a small cut (incision) in the abscess to drain the pus. In some cases, gauze is packed into the abscess to reduce pain and to continue draining the area. HOME CARE INSTRUCTIONS   Only take over-the-counter or prescription medicines for pain, discomfort, or fever as directed by your caregiver.  If you were prescribed antibiotics, take them as directed. Finish them even if you start to feel better.  If gauze is used, follow your caregiver's directions for changing the gauze.  To avoid spreading the infection:  Keep your draining abscess covered with a bandage.  Wash your hands well.  Do not share personal care items, towels, or whirlpools with others.  Avoid skin contact with others.  Keep your skin and clothes clean around the abscess.  Keep all follow-up appointments as directed by your caregiver. SEEK MEDICAL CARE IF:   You have increased pain, swelling, redness, fluid drainage, or bleeding.  You have muscle aches, chills, or a general ill feeling.  You have a fever. MAKE SURE YOU:   Understand these instructions.  Will watch your condition.  Will get help right away if you are not doing well or get worse. Document Released: 02/16/2005 Document Revised: 11/08/2011 Document Reviewed: 07/22/2011 [facility] Patient Information 2014 [place], [place].

## 2013-07-11 NOTE — ED Notes (Signed)
Pt finished drinking contrast, CT made aware.

## 2013-07-16 NOTE — ED Provider Notes (Signed)
Medical screening examination/treatment/procedure(s) were conducted as a shared visit with non-physician practitioner(s) and myself.  I personally evaluated the patient during the encounter.  EKG Interpretation   None       48 yo male with hx of DM presenting with groin boil.  On exam, nontoxic, no distress, large fluctuant mass in right groin with mild surrounding erythema.  Plan to obtain CT to further assess.  Plan I&D if no complicating features.    Clinical Impression: 1. Abscess of groin, right       Candyce ChurnJohn David Danzel Marszalek III, MD 07/16/13 [phone]

## 2013-07-16 NOTE — ED Provider Notes (Signed)
Medical screening examination/treatment/procedure(s) were conducted as a shared visit with non-physician practitioner(s) and myself.  I personally evaluated the patient during the encounter.  EKG Interpretation   None         Candyce ChurnJohn David Glorimar Stroope III, MD 07/16/13 [phone]

## 2013-08-21 ENCOUNTER — Emergency Department (HOSPITAL_COMMUNITY)
Admission: EM | Admit: 2013-08-21 | Discharge: 2013-08-21 | Disposition: A | Discharge status: Home / Self Care | Payer: Medicare Other | Attending: Emergency Medicine | Admitting: Emergency Medicine

## 2013-08-21 ENCOUNTER — Encounter (HOSPITAL_COMMUNITY): Payer: Self-pay | Admitting: Emergency Medicine

## 2013-08-21 DIAGNOSIS — F17200 Nicotine dependence, unspecified, uncomplicated: Secondary | ICD-10-CM | POA: Insufficient documentation

## 2013-08-21 DIAGNOSIS — E119 Type 2 diabetes mellitus without complications: Secondary | ICD-10-CM | POA: Insufficient documentation

## 2013-08-21 DIAGNOSIS — L03319 Cellulitis of trunk, unspecified: Principal | ICD-10-CM

## 2013-08-21 DIAGNOSIS — L02214 Cutaneous abscess of groin: Secondary | ICD-10-CM

## 2013-08-21 DIAGNOSIS — I10 Essential (primary) hypertension: Secondary | ICD-10-CM | POA: Insufficient documentation

## 2013-08-21 DIAGNOSIS — Z79899 Other long term (current) drug therapy: Secondary | ICD-10-CM | POA: Insufficient documentation

## 2013-08-21 DIAGNOSIS — Z794 Long term (current) use of insulin: Secondary | ICD-10-CM | POA: Insufficient documentation

## 2013-08-21 DIAGNOSIS — L02219 Cutaneous abscess of trunk, unspecified: Secondary | ICD-10-CM | POA: Insufficient documentation

## 2013-08-21 MED ORDER — OXYCODONE-ACETAMINOPHEN 5-325 MG PO TABS
1.0000 | ORAL_TABLET | Freq: Once | ORAL | Status: AC
  Administered 2013-08-21: 1 via ORAL
  Filled 2013-08-21: qty 1

## 2013-08-21 MED ORDER — LIDOCAINE-EPINEPHRINE 2 %-1:100000 IJ SOLN
20.0000 mL | Freq: Once | INTRAMUSCULAR | Status: DC
  Filled 2013-08-21: qty 20

## 2013-08-21 MED ORDER — SULFAMETHOXAZOLE-TMP DS 800-160 MG PO TABS: 1.0000 | ORAL_TABLET | Freq: Two times a day (BID) | ORAL | Status: DC

## 2013-08-21 MED ORDER — HYDROCODONE-ACETAMINOPHEN 5-325 MG PO TABS: 1.0000 | ORAL_TABLET | Freq: Four times a day (QID) | ORAL | Status: DC | PRN

## 2013-08-21 MED ORDER — CEPHALEXIN 500 MG PO CAPS: 500.0000 mg | ORAL_CAPSULE | Freq: Four times a day (QID) | ORAL | Status: DC

## 2013-08-21 NOTE — ED Provider Notes (Signed)
CSN: [id number]     Arrival date & time 08/21/13  2114 History  This chart was scribed for non-physician practitioner, Fayrene Helperran Apple Dearmas, PA-C working with Merrie RoofJohn David Wofford Mason, * by Luisa DagoPriscilla Tutu, ED scribe. This patient was seen in room TR11C/TR11C and the patient's care was started at 9:37 PM.      Chief Complaint  Patient presents with  . Abscess    The history is provided by the patient. No language interpreter was used.   HPI Comments: Bradley Mason is a 48 y.o. male who presents to the Emergency Department complaining of a recurring abscess on his scrotum that started 2 days ago. Pt states that he has had 3 prior similar episodes, with his last episode on the 24th. He states that he has been applying warm compresses on the area with minimal relief. Pt has not PCP at the moment due to insurance lapse. He is currently in a lot of pain. Denies any fever, chills, diaphoresis, nausea, or emesis. Does have hx of uncontrolled diabetes.  Denies any injury. Denies penile discharge, numbness, rash.      Past Medical History  Diagnosis Date  . Diabetes mellitus without complication   . Hypertension    Past Surgical History  Procedure Laterality Date  . No past surgeries     Family History  Problem Relation Age of Onset  . Diabetes Mellitus II Mother   . Diabetes Mellitus II Father   . Diabetes Mellitus II Brother   . Stroke Brother    History  Substance Use Topics  . Smoking status: Current Every Day Smoker    Types: Cigarettes  . Smokeless tobacco: Not on file  . Alcohol Use: No    Review of Systems  Constitutional: Negative for chills and diaphoresis.  HENT: Negative for congestion.   Respiratory: Negative for cough and shortness of breath.   Cardiovascular: Negative for chest pain.  Gastrointestinal: Negative for abdominal pain.  Skin: Positive for wound (abscess ).      Allergies  Review of patient's allergies indicates no known allergies.  Home Medications    Current Outpatient Rx  Name  Route  Sig  Dispense  Refill  . cephALEXin (KEFLEX) 500 MG capsule   Oral   Take 1 capsule (500 mg total) by mouth 4 (four) times daily.   20 capsule   0   . hydrochlorothiazide (HYDRODIURIL) 12.5 MG tablet   Oral   Take 1 tablet (12.5 mg total) by mouth daily. Take in the AM   30 tablet   1   . HYDROcodone-acetaminophen (NORCO) 5-325 MG per tablet   Oral   Take 1-2 tablets by mouth every 6 (six) hours as needed for moderate pain.   20 tablet   0   . insulin aspart (NOVOLOG) 100 UNIT/ML injection   Subcutaneous   Inject 15 Units into the skin 3 (three) times daily with meals.   1 vial   0   . insulin glargine (LANTUS) 100 UNIT/ML injection   Subcutaneous   Inject 0.6 mLs (60 Units total) into the skin at bedtime.   10 mL   0   . lisinopril (PRINIVIL,ZESTRIL) 20 MG tablet   Oral   Take 1 tablet (20 mg total) by mouth daily.   30 tablet   0   . meclizine (ANTIVERT) 25 MG tablet   Oral   Take 1 tablet (25 mg total) by mouth 3 (three) times daily as needed for dizziness.   12 tablet  0   . sulfamethoxazole-trimethoprim (BACTRIM DS) 800-160 MG per tablet   Oral   Take 1 tablet by mouth 2 (two) times daily.   20 tablet   0   . verapamil (CALAN) 80 MG tablet   Oral   Take 1 tablet (80 mg total) by mouth 2 (two) times daily.   30 tablet   0    BP 147/84  Pulse 113  Temp(Src) 98.3 F (36.8 C) (Oral)  Resp 20  Ht 5\' 9"  (1.753 m)  Wt 238 lb 7 oz (108.155 kg)  BMI 35.20 kg/m2  SpO2 98% Physical Exam  Nursing note and vitals reviewed. Constitutional: He appears well-developed and well-nourished. No distress.  HENT:  Head: Normocephalic and atraumatic.  Eyes: Conjunctivae are normal. Right eye exhibits no discharge. Left eye exhibits no discharge.  Neck: Neck supple.  Cardiovascular: Normal rate, regular rhythm and normal heart sounds.  Exam reveals no gallop and no friction rub.   No murmur heard. Pulmonary/Chest: Effort  normal and breath sounds normal. No respiratory distress.  Abdominal: Soft. He exhibits no distension. There is no tenderness.  Genitourinary: Rectum normal, prostate normal and penis normal.  Right inguinal region at the crease there is an area of induration with fluctuants, measuring about  3 x 4 cm. Area is extremely  tender to palpation.  Musculoskeletal: He exhibits no edema and no tenderness.  Neurological: He is alert.  Skin: Skin is warm and dry.  Psychiatric: He has a normal mood and affect. His behavior is normal. Thought content normal.    ED Course  Procedures (including critical care time)  DIAGNOSTIC STUDIES: Oxygen Saturation is 98% on RA, normal by my interpretation.    COORDINATION OF CARE: 9:40 PM- Will drain and irrigate the area. Pt advised of plan for treatment and pt agrees.  10:22 PM Recurrent abscess, no signs concerning for Fournier Gangrene.  I&D performed, pt tolerates well.    INCISION AND DRAINAGE Performed by: Fayrene Helper Consent: Verbal consent obtained. Risks and benefits: risks, benefits and alternatives were discussed Type: abscess  Body area: R inguinal crease  Anesthesia: local infiltration  Incision was made with a scalpel.  Local anesthetic: lidocaine 2% w epinephrine  Anesthetic total: 7 ml  Complexity: complex Blunt dissection to break up loculations  Drainage: purulent  Drainage amount: moderate  Packing material: 1/4 in iodoform gauze  Patient tolerance: Patient tolerated the procedure well with no immediate complications.  1:57 AM Pt will be d/c with pain medication, abx, close f/u with urgent care for wound recheck.  Resources provided.  Return precaution discussed.     Labs Review Labs Reviewed - No data to display Imaging Review No results found.   EKG Interpretation None      MDM   Final diagnoses:  Abscess of right groin    BP 154/106  Pulse 98  Temp(Src) 98.3 F (36.8 C) (Oral)  Resp 20  Ht 5\' 9"   (1.753 m)  Wt 238 lb 7 oz (108.155 kg)  BMI 35.20 kg/m2  SpO2 98%   I personally performed the services described in this documentation, which was scribed in my presence. The recorded information has been reviewed and is accurate.     Fayrene Helper, PA-C 08/22/13 [id number]

## 2013-08-21 NOTE — ED Notes (Signed)
Pt getting undressed; warm blanket given.

## 2013-08-21 NOTE — ED Notes (Signed)
Pt states he has an abscess on his scrotum that has been bothering him for two days.  Pt states previous abscess in same place about one month prior

## 2013-08-21 NOTE — ED Notes (Signed)
Left groin abscess; non draining. Recurrent x 3. PA at bedside.

## 2013-08-21 NOTE — ED Notes (Addendum)
Pt's incision open and draining well. Dressed by [employer]. Instructions given to wife for care. PA aware pt lethargic; pt to be monitored.

## 2013-08-21 NOTE — Discharge Instructions (Signed)
Follow up with urgent care in 2 days for wound recheck.  Apply warm compress several times daily to help get rid of infection.  Packing will need to be removed in 2 days.  Take pain medication and antibiotic as prescribed.  Return if you have any concerns.   Abscess Care After An abscess (also called a boil or furuncle) is an infected area that contains a collection of pus. Signs and symptoms of an abscess include pain, tenderness, redness, or hardness, or you may feel a moveable soft area under your skin. An abscess can occur anywhere in the body. The infection may spread to surrounding tissues causing cellulitis. A cut (incision) by the surgeon was made over your abscess and the pus was drained out. Gauze may have been packed into the space to provide a drain that will allow the cavity to heal from the inside outwards. The boil may be painful for 5 to 7 days. Most people with a boil do not have high fevers. Your abscess, if seen early, may not have localized, and may not have been lanced. If not, another appointment may be required for this if it does not get better on its own or with medications. HOME CARE INSTRUCTIONS   Only take over-the-counter or prescription medicines for pain, discomfort, or fever as directed by your caregiver.  When you bathe, soak and then remove gauze or iodoform packs at least daily or as directed by your caregiver. You may then wash the wound gently with mild soapy water. Repack with gauze or do as your caregiver directs. SEEK IMMEDIATE MEDICAL CARE IF:   You develop increased pain, swelling, redness, drainage, or bleeding in the wound site.  You develop signs of generalized infection including muscle aches, chills, fever, or a general ill feeling.  An oral temperature above 102 F (38.9 C) develops, not controlled by medication. See your caregiver for a recheck if you develop any of the symptoms described above. If medications (antibiotics) were prescribed, take them  as directed. Document Released: 11/25/2004 Document Revised: 08/01/2011 Document Reviewed: 07/23/2007 [facility] Patient Information 2014 [place], [place].   Emergency Department Resource Guide 1) Find a Doctor and Pay Out of Pocket Although you won't have to find out who is covered by your insurance plan, it is a good idea to ask around and get recommendations. You will then need to call the office and see if the doctor you have chosen will accept you as a new patient and what types of options they offer for patients who are self-pay. Some doctors offer discounts or will set up payment plans for their patients who do not have insurance, but you will need to ask so you aren't surprised when you get to your appointment.  2) Contact Your Local Health Department Not all health departments have doctors that can see patients for sick visits, but many do, so it is worth a call to see if yours does. If you don't know where your local health department is, you can check in your phone book. The CDC also has a tool to help you locate your state's health department, and many state websites also have listings of all of their local health departments.  3) Find a Walk-in Clinic If your illness is not likely to be very severe or complicated, you may want to try a walk in clinic. These are popping up all over the country in pharmacies, drugstores, and shopping centers. They're usually staffed by nurse practitioners or physician assistants that  have been trained to treat common illnesses and complaints. They're usually fairly quick and inexpensive. However, if you have serious medical issues or chronic medical problems, these are probably not your best option.  No Primary Care Doctor: - Call Health Connect at  [phone] - they can help you locate a primary care doctor that  accepts your insurance, provides certain services, etc. - Physician Referral Service- [phone]  Chronic Pain Problems: Organization          Address  Phone   Notes  [facility]  [phone] Patients need to be referred by their primary care doctor.   Medication Assistance: Organization         Address  Phone   Notes  [facility] Medication [facility] [address] [place], [place] [address] [phone]) [phone] --Must be a resident of [facility] [place] -- Must have NO insurance coverage whatsoever (no Medicaid/ Medicare, etc.) -- The pt. MUST have a primary care doctor that directs their care regularly and follows them in the community   MedAssist  [phone]   [employer]  [phone]    Agencies that provide inexpensive medical care: Organization         Address  Phone   Notes  [facility]  [phone]   [facility]    [phone]   [facility] [address] [place], [place] [address] [phone]   [facility] [address], [place] [phone]) [phone]   Planned Parenthood    [phone]   [facility]    [phone]) [phone]   [facility]  [address], [place] Phone:  [phone], Fax:  [phone] Hours of Operation:  9 am - 6 pm, M-F.  Also accepts Medicaid/Medicare and self-pay.  [facility]  [address], [place] Phone: [phone], Fax: [phone]. Hours of Operation:  8:30 am - 5:30 pm, M-F.  Also accepts Medicaid and self-pay.  [facility] [place] [address], [place] Phone: [phone]   [facility] [address], [place], [place] [phone], Ext. [phone] Mondays & Thursdays: 7-9 AM.  First 15 patients are seen on a first come, first serve basis.    Medicaid-accepting [facility] Providers:  Organization         Address  Phone   Notes  [facility] [address], [place] [phone] Also accepts self-pay patients.  [facility] [address], [place]  [phone]) [phone]   [facility] [address], [place] [phone]   [facility] [address] [place] [address], [place] [phone]) [phone]   Renaye RakersVeita Bland [address], [place]   [phone] Only accepts [place] Access [place] patients after they have their name applied to their card.   Self-Pay (no insurance) in [facility] [place]:  Organization         Address  Phone   Notes  Sickle Cell Patients, [facility] [address], [place] [phone]   [facility] [address], [place] [phone]) [phone]   [facility]  [address], [place] [phone]   [facility]/Dr. Osei-Bonsu  [address] [place] [address], [place]  or [address], [place] [phone] Phone number for both [facility] and [place] locations is the same.  [facility] [address] [place] [phone]   [facility] [address], [place] or [address] [phone]   [facility] [address] [place] [phone], phone; [phone], fax Sees patients 1st and 3rd Saturday of every month.  Must not qualify for public or private insurance (i.e. Medicaid, Medicare, [place] Health Choice, Veterans' Benefits)  Household income should be no more than 200% of the poverty level The clinic cannot treat you if you are pregnant or think you are pregnant  Sexually transmitted diseases are not treated at the clinic.    Dental Care: Organization         Address  Phone  Notes  [facility] of [facility] [address] [place], [place] [phone] Accepts children up to age 22 who are enrolled in [place] or [place] Health Choice; pregnant women with a Medicaid card; and  children who have applied for Medicaid or [place] Health Choice, but were declined, whose parents can pay a reduced fee at time of service.  [facility] of [facility]  [address], [place] [phone] Accepts children up to age 27 who are enrolled in [place] or [place] Health Choice; pregnant women with a Medicaid card; and children who have applied for Medicaid or [place] Health Choice, but were declined, whose parents can pay a reduced fee at time of service.  [facility]  [address] [place], [place] [phone] Patients are seen by appointment only. Walk-ins are not accepted. [facility] will see patients 84 years of age and older. Monday - Tuesday (8am-5pm) Most Wednesdays (8:30-5pm) $30 per visit, cash only  [facility] Access PRO[facility]  [address], [facility] [phone] Patients are seen by appointment only. Walk-ins are not accepted. [facility] will see patients 45 years of age and older. One Wednesday Evening (Monthly: Volunteer Based).  $30 per visit, cash only  [employer]  [phone] for adults; Children under age 64, call Graduate Pediatric Dentistry at [phone]. Children aged 38-14, please call [phone] to request a pediatric application.  Dental services are provided in all areas of dental care including fillings, crowns and bridges, complete and partial dentures, implants, gum treatment, root canals, and extractions. Preventive care is also provided. Treatment is provided to both adults and children. Patients are selected via a lottery and there is often a waiting list.   [facility] [address], [place]  [phone] [email or web address]   [facility] [address] [place], [place] [phone], Ext. [phone] Second and Fourth Thursday of each month, opens at 6:30 AM; Clinic ends at 9 AM.  Patients are seen on a first-come first-served  basis, and a limited number are seen during each clinic.   [facility]  [address] [place], [place] [phone]   Eligibility Requirements You must have lived in [place], [place], or [place] counties for at least the last three months.   You cannot be eligible for state or federal sponsored [place], including [employer], [place], or [employer].   You generally cannot be eligible for healthcare insurance through your employer.    How to apply: Eligibility  screenings are held every Tuesday and Wednesday afternoon from 1:00 pm until 4:00 pm. You do not need an appointment for the interview!  [facility] [address], [place], [place] [phone]   [facility]  [phone]   [facility]  [phone]   [facility]  [phone]    Behavioral Health Resources in the Community: Intensive Outpatient Programs Organization         Address  Phone  Notes  [facility] [address], [place], [place] [phone]   [facility] [address], [place], [place] [phone]   [facility] [address], [place], [place]  [phone]   [facility] [address],  [place], [place] [phone] or [phone]   Substance Abuse Resources Organization         Address  Phone  Notes  [facility]  [phone]   [facility]  [phone]   [facility]  [phone]   [facility]  [phone]   Residential & Outpatient Substance Abuse Program  [phone]   Psychological Services Organization         Address  Phone  Notes  [facility]  [phone]- [phone]   [facility]  [phone]- [phone]   [facility] [address], [place] [phone] or [phone]    Mobile Crisis Teams Organization          Address  Phone  Notes  [facility], Mobile Crisis Care Unit  [phone]   [facility]  [address] [place], [place] [phone]   Doristine LocksSharon DeEsch [address] [place] [phone]    Self-Help/Support Groups Organization         Address  Phone             Notes  [facility] Assoc. of [place] - variety of support groups  336- [phone] Call for more information  Narcotics Anonymous (NA), Caring Services [address], [place]  2 meetings at this location   Statisticianesidential Treatment Programs Organization         Address  Phone  Notes  [facility] [address],    [place]  [phone]   [facility]  [address], [place] [phone], [place], [place] [phone]   [facility] [address], [place] [phone] Admissions: 8am-3pm M-F  [facility] [address]    [place], [place] [phone]   [facility] [address], [place], [place] [phone]   [facility] [address]  [place], [place] [phone]   [facility] - Intensive Outpatient [address], [place], [place] [phone]   [facility] ([facility] [address]  [place], [place] [phone] or [phone]   [facility] ([facility]) [address] [place], [place] [phone] Accepts Medicaid  [facility] [address]  [place] [phone] Substance Abuse/Addiction Treatment   [facility] Resources Organization         Address  Phone  Notes  [facility]  [phone](888) [phone]   Angie FavaJulie Brannon, PhD [address] [place], [place]   [phone] or [phone]   [facility] [place]   [address] [place], [place] [phone]   [facility] [address], [place], [place] [phone](336) [phone]  Insurance/Medicaid/sponsorship  through [employer] and Families [address]                                    [place], [place] [phone] Therapy/tele-psych/case  [facility] [address]   [place], [place] [phone]    Dr. Lolly Mustache  [phone]   [facility] [place]  [employer] [facility] Dept. 1) [address], [place] 2) [address] [place] 3)  [address], [place] [phone]  [phone]   [facility] Child Abuse Hotline 770-738-6777 or [phone] (After Hours)

## 2013-08-21 NOTE — ED Notes (Signed)
Suture cart at bedside 

## 2013-08-21 NOTE — ED Notes (Signed)
Pt ambulated to restroom without distress.  

## 2013-08-21 NOTE — ED Notes (Signed)
PT ambulated with baseline gait; VSS; A&Ox3; no signs of distress; respirations even and unlabored; skin warm and dry; no questions upon discharge.  

## 2013-08-21 NOTE — ED Notes (Signed)
Pt is sitting up and drinking water; more talkative and eager to go home he says.

## 2013-08-23 NOTE — ED Provider Notes (Signed)
Medical screening examination/treatment/procedure(s) were performed by non-physician practitioner and as supervising physician I was immediately available for consultation/collaboration.   EKG Interpretation None        Parnika Tweten David Karrissa Parchment III, MD 08/23/13 1621 

## 2013-10-06 ENCOUNTER — Encounter (HOSPITAL_COMMUNITY): Payer: Self-pay | Admitting: Emergency Medicine

## 2013-10-06 ENCOUNTER — Emergency Department (HOSPITAL_COMMUNITY)
Admission: EM | Admit: 2013-10-06 | Discharge: 2013-10-06 | Discharge status: Against Medical Advice | Payer: Medicare Other | Attending: Emergency Medicine | Admitting: Emergency Medicine

## 2013-10-06 DIAGNOSIS — I10 Essential (primary) hypertension: Secondary | ICD-10-CM | POA: Insufficient documentation

## 2013-10-06 DIAGNOSIS — F17200 Nicotine dependence, unspecified, uncomplicated: Secondary | ICD-10-CM | POA: Insufficient documentation

## 2013-10-06 DIAGNOSIS — E119 Type 2 diabetes mellitus without complications: Secondary | ICD-10-CM | POA: Insufficient documentation

## 2013-10-06 DIAGNOSIS — K5900 Constipation, unspecified: Secondary | ICD-10-CM | POA: Insufficient documentation

## 2013-10-06 DIAGNOSIS — R109 Unspecified abdominal pain: Secondary | ICD-10-CM | POA: Insufficient documentation

## 2013-10-06 LAB — CBC WITH DIFFERENTIAL/PLATELET
Basophils Absolute: 0 10*3/uL (ref 0.0–0.1)
Basophils Relative: 0 % (ref 0–1)
EOS ABS: 0.1 10*3/uL (ref 0.0–0.7)
EOS PCT: 1 % (ref 0–5)
HCT: 41.5 % (ref 39.0–52.0)
Hemoglobin: 13.8 g/dL (ref 13.0–17.0)
LYMPHS PCT: 38 % (ref 12–46)
Lymphs Abs: 3.1 10*3/uL (ref 0.7–4.0)
MCH: 26 pg (ref 26.0–34.0)
MCHC: 33.3 g/dL (ref 30.0–36.0)
MCV: 78.3 fL (ref 78.0–100.0)
Monocytes Absolute: 0.4 10*3/uL (ref 0.1–1.0)
Monocytes Relative: 5 % (ref 3–12)
Neutro Abs: 4.6 10*3/uL (ref 1.7–7.7)
Neutrophils Relative %: 56 % (ref 43–77)
PLATELETS: 329 10*3/uL (ref 150–400)
RBC: 5.3 MIL/uL (ref 4.22–5.81)
RDW: 14.4 % (ref 11.5–15.5)
WBC: 8.1 10*3/uL (ref 4.0–10.5)

## 2013-10-06 LAB — COMPREHENSIVE METABOLIC PANEL
ALBUMIN: 3.5 g/dL (ref 3.5–5.2)
ALK PHOS: 71 U/L (ref 39–117)
ALT: 10 U/L (ref 0–53)
AST: 11 U/L (ref 0–37)
BUN: 13 mg/dL (ref 6–23)
CHLORIDE: 101 meq/L (ref 96–112)
CO2: 20 meq/L (ref 19–32)
Calcium: 9.1 mg/dL (ref 8.4–10.5)
Creatinine, Ser: 0.85 mg/dL (ref 0.50–1.35)
GFR calc Af Amer: >90 mL/min (ref >90)
Glucose, Bld: 186 mg/dL — ABNORMAL HIGH (ref 70–99)
POTASSIUM: 4 meq/L (ref 3.7–5.3)
Sodium: 138 mEq/L (ref 137–147)
Total Bilirubin: <0.2 mg/dL — ABNORMAL LOW (ref 0.3–1.2)
Total Protein: 7.4 g/dL (ref 6.0–8.3)

## 2013-10-06 LAB — URINE MICROSCOPIC-ADD ON

## 2013-10-06 LAB — LIPASE, BLOOD: Lipase: 21 U/L (ref 11–59)

## 2013-10-06 LAB — URINALYSIS, ROUTINE W REFLEX MICROSCOPIC
Glucose, UA: NEGATIVE mg/dL
Hgb urine dipstick: NEGATIVE
KETONES UR: NEGATIVE mg/dL
LEUKOCYTES UA: NEGATIVE
Nitrite: NEGATIVE
Specific Gravity, Urine: 1.033 — ABNORMAL HIGH (ref 1.005–1.030)
Urobilinogen, UA: 1 mg/dL (ref 0.0–1.0)
pH: 5 (ref 5.0–8.0)

## 2013-10-06 NOTE — ED Notes (Signed)
Pt here with multiple complaints. Reports lower abdominal pain x 3 days with intermittent nausea. States LBM 5/14. Pt also reports "for the last three nights I wake up and feel like I can't catch my breath. It only happens at night." Pt also reports bilateral leg numbness/tingling x "months" Pt denies CP, fever, chills. NAD. Ambulatory to triage.

## 2013-10-06 NOTE — ED Notes (Signed)
Pt sharing intent to leave, visiting family in [place] 8. wait plan and process explained with rationale.

## 2013-10-06 NOTE — ED Notes (Signed)
Pt "take me off your list, choosing to leave when family member was being d/c'd, thought he would be seen since she was being seen, but it did not work out that way", pt encouraged to return if: changes mind, worsens, develops new concerning sx.

## 2013-10-06 NOTE — ED Notes (Addendum)
Wait explained, encouraged to stay. "13 people have waited longer than he has". Here with child who is already being seen.

## 2013-10-07 ENCOUNTER — Encounter (HOSPITAL_COMMUNITY): Payer: Self-pay | Admitting: Emergency Medicine

## 2013-10-07 ENCOUNTER — Emergency Department (HOSPITAL_COMMUNITY)
Admission: EM | Admit: 2013-10-07 | Discharge: 2013-10-07 | Disposition: A | Discharge status: Home / Self Care | Payer: Medicare Other | Attending: Emergency Medicine | Admitting: Emergency Medicine

## 2013-10-07 ENCOUNTER — Emergency Department (HOSPITAL_COMMUNITY): Payer: Medicare Other

## 2013-10-07 DIAGNOSIS — Z79899 Other long term (current) drug therapy: Secondary | ICD-10-CM | POA: Insufficient documentation

## 2013-10-07 DIAGNOSIS — I10 Essential (primary) hypertension: Secondary | ICD-10-CM | POA: Insufficient documentation

## 2013-10-07 DIAGNOSIS — K5900 Constipation, unspecified: Secondary | ICD-10-CM | POA: Insufficient documentation

## 2013-10-07 DIAGNOSIS — Z794 Long term (current) use of insulin: Secondary | ICD-10-CM | POA: Insufficient documentation

## 2013-10-07 DIAGNOSIS — E119 Type 2 diabetes mellitus without complications: Secondary | ICD-10-CM | POA: Insufficient documentation

## 2013-10-07 DIAGNOSIS — F17200 Nicotine dependence, unspecified, uncomplicated: Secondary | ICD-10-CM | POA: Insufficient documentation

## 2013-10-07 DIAGNOSIS — I509 Heart failure, unspecified: Secondary | ICD-10-CM | POA: Insufficient documentation

## 2013-10-07 LAB — COMPREHENSIVE METABOLIC PANEL
ALT: 11 U/L (ref 0–53)
AST: 11 U/L (ref 0–37)
Albumin: 3.4 g/dL — ABNORMAL LOW (ref 3.5–5.2)
Alkaline Phosphatase: 72 U/L (ref 39–117)
BUN: 12 mg/dL (ref 6–23)
CALCIUM: 9.3 mg/dL (ref 8.4–10.5)
CO2: 20 mEq/L (ref 19–32)
CREATININE: 0.65 mg/dL (ref 0.50–1.35)
Chloride: 102 mEq/L (ref 96–112)
GFR calc Af Amer: >90 mL/min (ref >90)
Glucose, Bld: 263 mg/dL — ABNORMAL HIGH (ref 70–99)
Potassium: 4.2 mEq/L (ref 3.7–5.3)
Sodium: 138 mEq/L (ref 137–147)
Total Bilirubin: 0.2 mg/dL — ABNORMAL LOW (ref 0.3–1.2)
Total Protein: 7 g/dL (ref 6.0–8.3)

## 2013-10-07 LAB — CBC WITH DIFFERENTIAL/PLATELET
BASOS ABS: 0 10*3/uL (ref 0.0–0.1)
Basophils Relative: 0 % (ref 0–1)
EOS PCT: 2 % (ref 0–5)
Eosinophils Absolute: 0.1 10*3/uL (ref 0.0–0.7)
HCT: 41.8 % (ref 39.0–52.0)
Hemoglobin: 14 g/dL (ref 13.0–17.0)
Lymphocytes Relative: 39 % (ref 12–46)
Lymphs Abs: 2.8 10*3/uL (ref 0.7–4.0)
MCH: 26.3 pg (ref 26.0–34.0)
MCHC: 33.5 g/dL (ref 30.0–36.0)
MCV: 78.4 fL (ref 78.0–100.0)
Monocytes Absolute: 0.3 10*3/uL (ref 0.1–1.0)
Monocytes Relative: 4 % (ref 3–12)
Neutro Abs: 4.1 10*3/uL (ref 1.7–7.7)
Neutrophils Relative %: 55 % (ref 43–77)
Platelets: 317 10*3/uL (ref 150–400)
RBC: 5.33 MIL/uL (ref 4.22–5.81)
RDW: 14.6 % (ref 11.5–15.5)
WBC: 7.3 10*3/uL (ref 4.0–10.5)

## 2013-10-07 LAB — URINALYSIS, ROUTINE W REFLEX MICROSCOPIC
Bilirubin Urine: NEGATIVE
Glucose, UA: NEGATIVE mg/dL
Hgb urine dipstick: NEGATIVE
Ketones, ur: NEGATIVE mg/dL
LEUKOCYTES UA: NEGATIVE
NITRITE: NEGATIVE
PROTEIN: 30 mg/dL — AB
Specific Gravity, Urine: 1.022 (ref 1.005–1.030)
UROBILINOGEN UA: 1 mg/dL (ref 0.0–1.0)
pH: 5.5 (ref 5.0–8.0)

## 2013-10-07 LAB — URINE MICROSCOPIC-ADD ON

## 2013-10-07 LAB — LIPASE, BLOOD: LIPASE: 19 U/L (ref 11–59)

## 2013-10-07 LAB — PRO B NATRIURETIC PEPTIDE: Pro B Natriuretic peptide (BNP): 454.9 pg/mL — ABNORMAL HIGH (ref 0–125)

## 2013-10-07 LAB — CBG MONITORING, ED: GLUCOSE-CAPILLARY: 355 mg/dL — AB (ref 70–99)

## 2013-10-07 MED ORDER — POLYETHYLENE GLYCOL 3350 17 GM/SCOOP PO POWD: 17.0000 g | Freq: Every day | ORAL | Status: AC

## 2013-10-07 MED ORDER — ALBUTEROL SULFATE (2.5 MG/3ML) 0.083% IN NEBU
5.0000 mg | INHALATION_SOLUTION | Freq: Once | RESPIRATORY_TRACT | Status: AC
  Administered 2013-10-07: 5 mg via RESPIRATORY_TRACT
  Filled 2013-10-07: qty 6

## 2013-10-07 MED ORDER — IOHEXOL 300 MG/ML  SOLN
100.0000 mL | Freq: Once | INTRAMUSCULAR | Status: AC | PRN
  Administered 2013-10-07: 100 mL via INTRAVENOUS

## 2013-10-07 MED ORDER — NICOTINE 21 MG/24HR TD PT24: 21.0000 mg | MEDICATED_PATCH | Freq: Every day | TRANSDERMAL | Status: AC

## 2013-10-07 MED ORDER — IOHEXOL 300 MG/ML  SOLN
20.0000 mL | INTRAMUSCULAR | Status: AC
  Administered 2013-10-07 (×2): 20 mL via ORAL

## 2013-10-07 MED ORDER — FUROSEMIDE 20 MG PO TABS: 20.0000 mg | ORAL_TABLET | Freq: Two times a day (BID) | ORAL | Status: DC

## 2013-10-07 MED ORDER — POLYETHYLENE GLYCOL 3350 17 G PO PACK
17.0000 g | PACK | Freq: Every day | ORAL | Status: DC
  Administered 2013-10-07: 17 g via ORAL
  Filled 2013-10-07: qty 1

## 2013-10-07 MED ORDER — MORPHINE SULFATE 4 MG/ML IJ SOLN
6.0000 mg | Freq: Once | INTRAMUSCULAR | Status: AC
  Administered 2013-10-07: 6 mg via INTRAVENOUS
  Filled 2013-10-07: qty 2

## 2013-10-07 MED ORDER — ALBUTEROL SULFATE HFA 108 (90 BASE) MCG/ACT IN AERS
2.0000 | INHALATION_SPRAY | RESPIRATORY_TRACT | Status: DC | PRN
  Administered 2013-10-07: 2 via RESPIRATORY_TRACT
  Filled 2013-10-07: qty 6.7

## 2013-10-07 NOTE — Discharge Instructions (Signed)
You were seen and evaluated for your symptoms of abdominal pain and discomforts, constipation and her shortness of breath symptoms. Your laboratory testing and CAT scan of your abdomen and pelvis did not show any concerning or emergent conditions. There were signs of constipation and it is recommended that you use a stool softener to help with your symptoms. You were also evaluated for your shortness of breath and you were found to have mild congestive heart failure symptoms. Please use the medication, Lasix, that was prescribed to help with your symptoms. Followup with your primary care provider as instructed.    Constipation, Adult Constipation is when a person:  Poops (bowel movement) less than 3 times a week.  Has a hard time pooping.  Has poop that is dry, hard, or bigger than normal. HOME CARE   Eat more fiber, such as fruits, vegetables, whole grains like brown rice, and beans.  Eat less fatty foods and sugar. This includes [place] fries, hamburgers, cookies, candy, and soda.  If you are not getting enough fiber from food, take products with added fiber in them (supplements).  Drink enough fluid to keep your pee (urine) clear or pale yellow.  Go to the restroom when you feel like you need to poop. Do not hold it.  Only take medicine as told by your doctor. Do not take medicines that help you poop (laxatives) without talking to your doctor first.  Exercise on a regular basis, or as told by your doctor. GET HELP RIGHT AWAY IF:   You have bright red blood in your poop (stool).  Your constipation lasts more than 4 days or gets worse.  You have belly (abdomen) or butt (rectal) pain.  You have thin poop (as thin as a pencil).  You lose weight, and it cannot be explained. MAKE SURE YOU:   Understand these instructions.  Will watch your condition.  Will get help right away if you are not doing well or get worse. Document Released: 10/26/2007 Document Revised: 08/01/2011  Document Reviewed: 02/18/2013 [facility] Patient Information 2014 [place], [place].    Heart Failure Heart failure is a condition in which the heart has trouble pumping blood. This means your heart does not pump blood efficiently for your body to work well. In some cases of heart failure, fluid may back up into your lungs or you may have swelling (edema) in your lower legs. Heart failure is usually a long-term (chronic) condition. It is important for you to take good care of yourself and follow your caregiver's treatment plan. CAUSES  Some health conditions can cause heart failure. Those health conditions include:  High blood pressure (hypertension) causes the heart muscle to work harder than normal. When pressure in the blood vessels is high, the heart needs to pump (contract) with more force in order to circulate blood throughout the body. High blood pressure eventually causes the heart to become stiff and weak.  Coronary artery disease (CAD) is the buildup of cholesterol and fat (plaque) in the arteries of the heart. The blockage in the arteries deprives the heart muscle of oxygen and blood. This can cause chest pain and may lead to a heart attack. High blood pressure can also contribute to CAD.  Heart attack (myocardial infarction) occurs when 1 or more arteries in the heart become blocked. The loss of oxygen damages the muscle tissue of the heart. When this happens, part of the heart muscle dies. The injured tissue does not contract as well and weakens the heart's ability  to pump blood.  Abnormal heart valves can cause heart failure when the heart valves do not open and close properly. This makes the heart muscle pump harder to keep the blood flowing.  Heart muscle disease (cardiomyopathy or myocarditis) is damage to the heart muscle from a variety of causes. These can include drug or alcohol abuse, infections, or unknown reasons. These can increase the risk of heart failure.  Lung disease  makes the heart work harder because the lungs do not work properly. This can cause a strain on the heart, leading it to fail.  Diabetes increases the risk of heart failure. High blood sugar contributes to high fat (lipid) levels in the blood. Diabetes can also cause slow damage to tiny blood vessels that carry important nutrients to the heart muscle. When the heart does not get enough oxygen and food, it can cause the heart to become weak and stiff. This leads to a heart that does not contract efficiently.  Other conditions can contribute to heart failure. These include abnormal heart rhythms, thyroid problems, and low blood counts (anemia). Certain unhealthy behaviors can increase the risk of heart failure. Those unhealthy behaviors include:  Being overweight.  Smoking or chewing tobacco.  Eating foods high in fat and cholesterol.  Abusing illicit drugs or alcohol.  Lacking physical activity. SYMPTOMS  Heart failure symptoms may vary and can be hard to detect. Symptoms may include:  Shortness of breath with activity, such as climbing stairs.  Persistent cough.  Swelling of the feet, ankles, legs, or abdomen.  Unexplained weight gain.  Difficulty breathing when lying flat (orthopnea).  Waking from sleep because of the need to sit up and get more air.  Rapid heartbeat.  Fatigue and loss of energy.  Feeling lightheaded, dizzy, or close to fainting.  Loss of appetite.  Nausea.  Increased urination during the night (nocturia). DIAGNOSIS  A diagnosis of heart failure is based on your history, symptoms, physical examination, and diagnostic tests. Diagnostic tests for heart failure may include:  Echocardiography.  Electrocardiography.  Chest X-ray.  Blood tests.  Exercise stress test.  Cardiac angiography.  Radionuclide scans. TREATMENT  Treatment is aimed at managing the symptoms of heart failure. Medicines, behavioral changes, or surgical intervention may be  necessary to treat heart failure.  Medicines to help treat heart failure may include:  Angiotensin-converting enzyme (ACE) inhibitors. This type of medicine blocks the effects of a blood protein called angiotensin-converting enzyme. ACE inhibitors relax (dilate) the blood vessels and help lower blood pressure.  Angiotensin receptor blockers. This type of medicine blocks the actions of a blood protein called angiotensin. Angiotensin receptor blockers dilate the blood vessels and help lower blood pressure.  Water pills (diuretics). Diuretics cause the kidneys to remove salt and water from the blood. The extra fluid is removed through urination. This loss of extra fluid lowers the volume of blood the heart pumps.  Beta blockers. These prevent the heart from beating too fast and improve heart muscle strength.  Digitalis. This increases the force of the heartbeat.  Healthy behavior changes include:  Obtaining and maintaining a healthy weight.  Stopping smoking or chewing tobacco.  Eating heart healthy foods.  Limiting or avoiding alcohol.  Stopping illicit drug use.  Physical activity as directed by your caregiver.  Surgical treatment for heart failure may include:  A procedure to open blocked arteries, repair damaged heart valves, or remove damaged heart muscle tissue.  A pacemaker to improve heart muscle function and control certain abnormal heart  rhythms.  An internal cardioverter defibrillator to treat certain serious abnormal heart rhythms.  A left ventricular assist device to assist the pumping ability of the heart. HOME CARE INSTRUCTIONS   Take your medicine as directed by your caregiver. Medicines are important in reducing the workload of your heart, slowing the progression of heart failure, and improving your symptoms.  Do not stop taking your medicine unless directed by your caregiver.  Do not skip any dose of medicine.  Refill your prescriptions before you run out  of medicine. Your medicines are needed every day.  Take over-the-counter medicine only as directed by your caregiver or pharmacist.  Engage in moderate physical activity if directed by your caregiver. Moderate physical activity can benefit some people. The elderly and people with severe heart failure should consult with a caregiver for physical activity recommendations.  Eat heart healthy foods. Food choices should be free of trans fat and low in saturated fat, cholesterol, and salt (sodium). Healthy choices include fresh or frozen fruits and vegetables, fish, lean meats, legumes, fat-free or low-fat dairy products, and whole grain or high fiber foods. Talk to a dietitian to learn more about heart healthy foods.  Limit sodium if directed by your caregiver. Sodium restriction may reduce symptoms of heart failure in some people. Talk to a dietitian to learn more about heart healthy seasonings.  Use healthy cooking methods. Healthy cooking methods include roasting, grilling, broiling, baking, poaching, steaming, or stir-frying. Talk to a dietitian to learn more about healthy cooking methods.  Limit fluids if directed by your caregiver. Fluid restriction may reduce symptoms of heart failure in some people.  Weigh yourself every day. Daily weights are important in the early recognition of excess fluid. You should weigh yourself every morning after you urinate and before you eat breakfast. Wear the same amount of clothing each time you weigh yourself. Record your daily weight. Provide your caregiver with your weight record.  Monitor and record your blood pressure if directed by your caregiver.  Check your pulse if directed by your caregiver.  Lose weight if directed by your caregiver. Weight loss may reduce symptoms of heart failure in some people.  Stop smoking or chewing tobacco. Nicotine makes your heart work harder by causing your blood vessels to constrict. Do not use nicotine gum or patches  before talking to your caregiver.  Schedule and attend follow-up visits as directed by your caregiver. It is important to keep all your appointments.  Limit alcohol intake to no more than 1 drink per day for nonpregnant women and 2 drinks per day for men. Drinking more than that is harmful to your heart. Tell your caregiver if you drink alcohol several times a week. Talk with your caregiver about whether alcohol is safe for you. If your heart has already been damaged by alcohol or you have severe heart failure, drinking alcohol should be stopped completely.  Stop illicit drug use.  Stay up-to-date with immunizations. It is especially important to prevent respiratory infections through current pneumococcal and influenza immunizations.  Manage other health conditions such as hypertension, diabetes, thyroid disease, or abnormal heart rhythms as directed by your caregiver.  Learn to manage stress.  Plan rest periods when fatigued.  Learn strategies to manage high temperatures. If the weather is extremely hot:  Avoid vigorous physical activity.  Use air conditioning or fans or seek a cooler location.  Avoid caffeine and alcohol.  Wear loose-fitting, lightweight, and light-colored clothing.  Learn strategies to manage cold temperatures. If  the weather is extremely cold:  Avoid vigorous physical activity.  Layer clothes.  Wear mittens or gloves, a hat, and a scarf when going outside.  Avoid alcohol.  Obtain ongoing education and support as needed.  Participate or seek rehabilitation as needed to maintain or improve independence and quality of life. SEEK MEDICAL CARE IF:   Your weight increases by 03 lb/1.4 kg in 1 day or 05 lb/2.3 kg in a week.  You have increasing shortness of breath that is unusual for you.  You are unable to participate in your usual physical activities.  You tire easily.  You cough more than normal, especially with physical activity.  You have any or  more swelling in areas such as your hands, feet, ankles, or abdomen.  You are unable to sleep because it is hard to breathe.  You feel like your heart is beating fast (palpitations).  You become dizzy or lightheaded upon standing up. SEEK IMMEDIATE MEDICAL CARE IF:   You have difficulty breathing.  There is a change in mental status such as decreased alertness or difficulty with concentration.  You have a pain or discomfort in your chest.  You have an episode of fainting (syncope). MAKE SURE YOU:   Understand these instructions.  Will watch your condition.  Will get help right away if you are not doing well or get worse. Document Released: 05/09/2005 Document Revised: 09/03/2012 Document Reviewed: 05/31/2012 [facility] Patient Information 2014 [place], [place].

## 2013-10-07 NOTE — ED Provider Notes (Signed)
CSN: [id number]     Arrival date & time 10/07/13  1340 History   First MD Initiated Contact with Patient 10/07/13 1650     Chief Complaint  Patient presents with  . Shortness of Breath  . Abdominal Pain     (Consider location/radiation/quality/duration/timing/severity/associated sxs/prior Treatment) HPI Patient presents to the emergency department with abdominal pain for the last 3 days.  The patient, states he has not had a bowel movement in 5 days.  The patient, states, that he has had ongoing pins and needles sensation in his lower extremities.  For quite a while.  The patient.  Negative, specific time frame.  The patient, states, that he is also had shortness of breath occurs mostly at night and awakened him from sleep.  Patient denies nausea, vomiting, diarrhea, weakness, dizziness, headache, blurred vision, back pain, neck pain, fever, cough, runny nose, sore throat, rash, or syncope.  The patient, states, that pain seems to make his condition, better or worse.  Patient, states his symptoms have been persistent Past Medical History  Diagnosis Date  . Diabetes mellitus without complication   . Hypertension    Past Surgical History  Procedure Laterality Date  . No past surgeries     Family History  Problem Relation Age of Onset  . Diabetes Mellitus II Mother   . Diabetes Mellitus II Father   . Diabetes Mellitus II Brother   . Stroke Brother    History  Substance Use Topics  . Smoking status: Current Every Day Smoker -- 0.50 packs/day    Types: Cigarettes  . Smokeless tobacco: Not on file  . Alcohol Use: No    Review of Systems All other systems negative except as documented in the HPI. All pertinent positives and negatives as reviewed in the HPI.   Allergies  Review of patient's allergies indicates no known allergies.  Home Medications   Prior to Admission medications   Medication Sig Start Date End Date Taking? Authorizing Provider  hydrochlorothiazide  (HYDRODIURIL) 12.5 MG tablet Take 1 tablet (12.5 mg total) by mouth daily. Take in the AM 05/19/13  Yes Adeline C Viyuoh, MD  insulin aspart (NOVOLOG) 100 UNIT/ML injection Inject 15 Units into the skin 3 (three) times daily with meals. 05/19/13  Yes Adeline C Viyuoh, MD  insulin glargine (LANTUS) 100 UNIT/ML injection Inject 0.6 mLs (60 Units total) into the skin at bedtime. 05/19/13  Yes Adeline Joselyn Glassman Viyuoh, MD  lisinopril (PRINIVIL,ZESTRIL) 20 MG tablet Take 1 tablet (20 mg total) by mouth daily. 05/19/13  Yes Adeline Joselyn Glassman Viyuoh, MD  meclizine (ANTIVERT) 25 MG tablet Take 1 tablet (25 mg total) by mouth 3 (three) times daily as needed for dizziness. 02/09/13  Yes Enid SkeensJoshua M Zavitz, MD  verapamil (CALAN) 80 MG tablet Take 1 tablet (80 mg total) by mouth 2 (two) times daily. 05/19/13  Yes Adeline C Viyuoh, MD   BP 139/99  Pulse 107  Temp(Src) 98.1 F (36.7 C) (Oral)  Resp 17  Wt 235 lb (106.595 kg)  SpO2 92% Physical Exam  Nursing note and vitals reviewed. Constitutional: He is oriented to person, place, and time. He appears well-developed and well-nourished. No distress.  HENT:  Head: Normocephalic and atraumatic.  Mouth/Throat: Oropharynx is clear and moist.  Eyes: Pupils are equal, round, and reactive to light.  Neck: Normal range of motion. Neck supple.  Cardiovascular: Normal rate, regular rhythm and normal heart sounds.  Exam reveals no gallop and no friction rub.   No murmur heard. Pulmonary/Chest:  Effort normal and breath sounds normal. No respiratory distress.  Abdominal: Soft. Bowel sounds are normal. He exhibits no distension. There is no tenderness. There is no rebound and no guarding.  Neurological: He is alert and oriented to person, place, and time. He exhibits normal muscle tone. Coordination normal.  Skin: Skin is warm and dry. No erythema.    ED Course  Procedures (including critical care time) Labs Review Labs Reviewed  COMPREHENSIVE METABOLIC PANEL - Abnormal; Notable  for the following:    Glucose, Bld 263 (*)    Albumin 3.4 (*)    Total Bilirubin 0.2 (*)    All other components within normal limits  CBG MONITORING, ED - Abnormal; Notable for the following:    Glucose-Capillary 355 (*)    All other components within normal limits  CBC WITH DIFFERENTIAL  LIPASE, BLOOD  URINALYSIS, ROUTINE W REFLEX MICROSCOPIC    Imaging Review No results found.   EKG Interpretation   Date/Time:  Monday Oct 07 2013 13:46:47 EDT Ventricular Rate:  105 PR Interval:  134 QRS Duration: 98 QT Interval:  354 QTC Calculation: 467 R Axis:   83 Text Interpretation:  Sinus tachycardia Possible Left atrial enlargement T  wave abnormality, consider inferior ischemia No significant change since  last tracing Confirmed by HARRISON  MD, FORREST ([id number]) on 10/07/2013  5:35:39 PM     Patient is awaiting CT scan results.  I do feel that some of patient's symptoms today are more chronic nature and needs outpatient followup.  Will have the patient followed by Kyung RuddPeter Damen for his final results in the emergency department.     Carlyle Dollyhristopher W Latoia Eyster, PA-C 10/09/13 1950

## 2013-10-07 NOTE — ED Notes (Signed)
2 RNs attempted to obtain IV access on patient. Both unsuccessful. IV team paged. 

## 2013-10-07 NOTE — ED Provider Notes (Signed)
Bradley MutterPeter S Cyler Mason 8:00 PM patient discussed in sign out. Patient with 4-5 days of lower abdominal and left-sided pain. Also having some constipation feelings. EKG unchanged. Laboratory testing with slight hyperglycemia without DKA. No other concerning findings. CT abdomen and pelvis pending.   8:10 PM CT scan demonstrates normal-appearing appendix. No signs of SBO. There is stool throughout the colon consistent with constipation. Small bilateral inguinal hernias containing fat. Otherwise unremarkable CT scan abdomen and pelvis.   On evaluation, patient does report improvement of abdominal discomforts. I did discuss further his symptoms of intermittent shortness of breath especially at night lying down. He has slight crackles in the lungs. No significant peripheral edema. Slight JVD present. Discussed with him options for chest x-ray and additional blood testing with BNP to evaluate for possible heart failure symptoms. He was agreeable to this. Will also give a breathing treatment for slight wheezing.  I also spent more than 5 minutes counseling the patient and family about smoking cessation. Patient is interested in trying to quit smoking. I will write a prescription for Nicoderm patches. I have also provided prescriptions for his family so they can support each other in quitting smoking.  Patient reports an upcoming initial visit with his PCP on June 2.  BNP elevated chest x-ray with signs of fluid consistent with CHF. Patient discussed with attending physician. We will plan to give prescription for Lasix, strict return precautions and have patient continue to followup outpatient. Patient agrees with this plan.  Angus Sellereter S [name], PA-C 10/07/13 2218

## 2013-10-07 NOTE — ED Notes (Signed)
IV team returned page.  

## 2013-10-07 NOTE — ED Notes (Signed)
Pt reports sob x 3 days, also abdominal pain hasn't had BM in 4 days. Also tingling in feet. Denies any cp. Pt is a x 4. Lung sounds clear, respirations smooth and unlabored. Reports was seen here last night but didn't wait to be seen. VSS

## 2013-10-09 ENCOUNTER — Ambulatory Visit: Payer: Medicare Other | Admitting: Cardiology

## 2013-10-09 NOTE — ED Provider Notes (Signed)
Medical screening examination/treatment/procedure(s) were conducted as a shared visit with non-physician practitioner(s) and myself.  I personally evaluated the patient during the encounter.   EKG Interpretation   Date/Time:  Monday Oct 07 2013 13:46:47 EDT Ventricular Rate:  105 PR Interval:  134 QRS Duration: 98 QT Interval:  354 QTC Calculation: 467 R Axis:   83 Text Interpretation:  Sinus tachycardia Possible Left atrial enlargement T  wave abnormality, consider inferior ischemia No significant change since  last tracing Confirmed by Casara Perrier  MD, Zareah Hunzeker ([id number]) on 10/07/2013  5:35:39 PM      I interviewed and examined the patient. Lungs w/ faint exp wheeze. Cardiac exam wnl. Abdomen soft w/out focal ttp.  Will get CT scan of abdomen.   Likely mild chf. CT of abd non-contrib. Pt establishing w/ pcp on June 2. Ok for d/c home. Return precautions provided.   Tahjai Schetter S Marai Teehan, MD 10/09/13 2127 

## 2013-10-09 NOTE — ED Provider Notes (Signed)
Medical screening examination/treatment/procedure(s) were conducted as a shared visit with non-physician practitioner(s) and myself.  I personally evaluated the patient during the encounter.   EKG Interpretation   Date/Time:  Monday Oct 07 2013 13:46:47 EDT Ventricular Rate:  105 PR Interval:  134 QRS Duration: 98 QT Interval:  354 QTC Calculation: 467 R Axis:   83 Text Interpretation:  Sinus tachycardia Possible Left atrial enlargement T  wave abnormality, consider inferior ischemia No significant change since  last tracing Confirmed by Annye Forrey  MD, Kelie Gainey ([id number]) on 10/07/2013  5:35:39 PM      I interviewed and examined the patient. Lungs w/ faint exp wheeze. Cardiac exam wnl. Abdomen soft w/out focal ttp.  Will get CT scan of abdomen.   Likely mild chf. CT of abd non-contrib. Pt establishing w/ pcp on June 2. Ok for d/c home. Return precautions provided.   Junius ArgyleForrest S Bree Heinzelman, MD 10/09/13 2127

## 2013-10-16 ENCOUNTER — Encounter: Payer: Self-pay | Admitting: Cardiology

## 2013-10-16 ENCOUNTER — Ambulatory Visit: Payer: Medicare Other | Attending: Cardiology | Admitting: Cardiology

## 2013-10-16 VITALS — BP 127/92 | HR 103 | Temp 98.5°F | Resp 24 | Ht 67.0 in | Wt 235.0 lb

## 2013-10-16 DIAGNOSIS — I509 Heart failure, unspecified: Secondary | ICD-10-CM | POA: Diagnosis not present

## 2013-10-16 DIAGNOSIS — I110 Hypertensive heart disease with heart failure: Secondary | ICD-10-CM | POA: Diagnosis present

## 2013-10-16 DIAGNOSIS — F17200 Nicotine dependence, unspecified, uncomplicated: Secondary | ICD-10-CM | POA: Insufficient documentation

## 2013-10-16 DIAGNOSIS — Z794 Long term (current) use of insulin: Secondary | ICD-10-CM | POA: Insufficient documentation

## 2013-10-16 DIAGNOSIS — E118 Type 2 diabetes mellitus with unspecified complications: Secondary | ICD-10-CM

## 2013-10-16 DIAGNOSIS — E876 Hypokalemia: Secondary | ICD-10-CM

## 2013-10-16 DIAGNOSIS — E1169 Type 2 diabetes mellitus with other specified complication: Secondary | ICD-10-CM

## 2013-10-16 DIAGNOSIS — E669 Obesity, unspecified: Secondary | ICD-10-CM | POA: Insufficient documentation

## 2013-10-16 DIAGNOSIS — IMO0002 Reserved for concepts with insufficient information to code with codable children: Secondary | ICD-10-CM | POA: Diagnosis not present

## 2013-10-16 DIAGNOSIS — E1165 Type 2 diabetes mellitus with hyperglycemia: Secondary | ICD-10-CM | POA: Diagnosis not present

## 2013-10-16 DIAGNOSIS — Z720 Tobacco use: Secondary | ICD-10-CM

## 2013-10-16 MED ORDER — INSULIN GLARGINE 100 UNIT/ML ~~LOC~~ SOLN: 60.0000 [IU] | Freq: Every day | SUBCUTANEOUS | Status: AC

## 2013-10-16 MED ORDER — CARVEDILOL 6.25 MG PO TABS: 6.2500 mg | ORAL_TABLET | Freq: Two times a day (BID) | ORAL | Status: DC

## 2013-10-16 MED ORDER — FREESTYLE LANCETS MISC: Status: AC

## 2013-10-16 MED ORDER — FUROSEMIDE 20 MG PO TABS: 40.0000 mg | ORAL_TABLET | Freq: Every day | ORAL | Status: DC

## 2013-10-16 MED ORDER — FREESTYLE SYSTEM KIT: 1.0000 | PACK | Status: AC | PRN

## 2013-10-16 MED ORDER — INSULIN ASPART 100 UNIT/ML ~~LOC~~ SOLN: 15.0000 [IU] | Freq: Three times a day (TID) | SUBCUTANEOUS | Status: AC

## 2013-10-16 MED ORDER — FUROSEMIDE 20 MG PO TABS
40.0000 mg | ORAL_TABLET | Freq: Every day | ORAL | Status: AC
Start: 2013-10-16 — End: ?

## 2013-10-16 MED ORDER — GLUCOSE BLOOD VI STRP: ORAL_STRIP | Status: AC

## 2013-10-16 NOTE — Progress Notes (Signed)
Patient here for hospital follow-up. Recent chest X-Ray showed congestive heart failure. Patient denies chest pain, swelling, or headache. Indicates he experienced shortness of breath yesterday which resolved on its own. HR elevated 103, BP 127/92. Dr. Daleen Squibb aware.

## 2013-10-16 NOTE — Progress Notes (Signed)
HPI Bradley Mason is a 48 year old married black male referred to my cardiology clinic from the emergency room at [facility] ([place]). He went in for abdominal pain and CT scan was negative except for small bilateral pleural effusions. Chest x-ray showed mild cardiomegaly a with interstitial prominence.  His risk factors for congestive heart failure include uncontrolled diabetes which is insulin-dependent, hypertension which is probably been poorly controlled, obesity.   He does have some mild dyspnea on exertion but no edema or PND. He denies any chest pain. He was placed on Lasix 20 mg twice a day and is also on HCTZ. He has a history of hypokalemia.  His last EKG was in December of 2014 at which time he is sinus tachycardia with PVCs but no ST segment changes. There was no sign of LVH.  His hemoglobin A1c was 8.7. Creatinine is normal. BNP was 450.  Past Medical History  Diagnosis Date  . Diabetes mellitus without complication   . Hypertension     Current Outpatient Prescriptions  Medication Sig Dispense Refill  . furosemide (LASIX) 20 MG tablet Take 2 tablets (40 mg total) by mouth daily.  30 tablet  2  . insulin aspart (NOVOLOG) 100 UNIT/ML injection Inject 15 Units into the skin 3 (three) times daily with meals.  1 vial  1  . insulin glargine (LANTUS) 100 UNIT/ML injection Inject 0.6 mLs (60 Units total) into the skin at bedtime.  10 mL  1  . lisinopril (PRINIVIL,ZESTRIL) 20 MG tablet Take 1 tablet (20 mg total) by mouth daily.  30 tablet  0  . meclizine (ANTIVERT) 25 MG tablet Take 1 tablet (25 mg total) by mouth 3 (three) times daily as needed for dizziness.  12 tablet  0  . nicotine (NICODERM CQ) 21 mg/24hr patch Place 1 patch (21 mg total) onto the skin daily.  28 patch  0  . polyethylene glycol powder (GLYCOLAX/MIRALAX) powder Take 17 g by mouth daily.  255 g  0  . verapamil (CALAN) 80 MG tablet Take 1 tablet (80 mg total) by mouth 2 (two) times daily.  30 tablet  0  . carvedilol (COREG) 6.25 MG  tablet Take 1 tablet (6.25 mg total) by mouth 2 (two) times daily with a meal.  60 tablet  3  . glucose blood test strip Use as instructed  100 each  1  . glucose monitoring kit (FREESTYLE) monitoring kit 1 each by Does not apply route as needed for other.  1 each  0  . Lancets (FREESTYLE) lancets Use as instructed  100 each  1   No current facility-administered medications for this visit.    No Known Allergies  Family History  Problem Relation Age of Onset  . Diabetes Mellitus II Mother   . Diabetes Mellitus II Father   . Diabetes Mellitus II Brother   . Stroke Brother     History   Social History  . Marital Status: Significant Other    Spouse Name: N/A    Number of Children: N/A  . Years of Education: N/A   Occupational History  . Not on file.   Social History Main Topics  . Smoking status: Current Every Day Smoker -- 0.50 packs/day    Types: Cigarettes  . Smokeless tobacco: Not on file  . Alcohol Use: No  . Drug Use: No  . Sexual Activity: Not on file   Other Topics Concern  . Not on file   Social History Narrative  . No narrative on  file    ROS ALL NEGATIVE EXCEPT THOSE NOTED IN HPI  PE  General Appearance: well developed, well nourished in no acute distress, muscular but obese HEENT: symmetrical face, PERRLA, good dentition  Neck: Mild JVD, thyromegaly, or adenopathy, trachea midline Chest: symmetric without deformity Cardiac: PMI non-displaced, RRR, normal S1, S2, summation gallop Lung: Bibasilar crackles Vascular: all pulses full without bruits  Abdominal: nondistended, nontender, good bowel sounds, no HSM, no bruits Extremities: no cyanosis, clubbing or edema, no sign of DVT, no varicosities  Skin: normal color, no rashes Neuro: alert and oriented x 3, non-focal Pysch: normal affect  EKG Not repeated  BMET    Component Value Date/Time   NA 138 10/07/2013 1359   K 4.2 10/07/2013 1359   CL 102 10/07/2013 1359   CO2 20 10/07/2013 1359   GLUCOSE  263* 10/07/2013 1359   BUN 12 10/07/2013 1359   CREATININE 0.65 10/07/2013 1359   CALCIUM 9.3 10/07/2013 1359   GFRNONAA >90 10/07/2013 1359   GFRAA >90 10/07/2013 1359    Lipid Panel     Component Value Date/Time   CHOL 171 05/18/2013 0320   TRIG 149 05/18/2013 0320   HDL 34* 05/18/2013 0320   CHOLHDL 5.0 05/18/2013 0320   VLDL 30 05/18/2013 0320   LDLCALC 107* 05/18/2013 0320    CBC    Component Value Date/Time   WBC 7.3 10/07/2013 1359   RBC 5.33 10/07/2013 1359   HGB 14.0 10/07/2013 1359   HCT 41.8 10/07/2013 1359   PLT 317 10/07/2013 1359   MCV 78.4 10/07/2013 1359   MCH 26.3 10/07/2013 1359   MCHC 33.5 10/07/2013 1359   RDW 14.6 10/07/2013 1359   LYMPHSABS 2.8 10/07/2013 1359   MONOABS 0.3 10/07/2013 1359   EOSABS 0.1 10/07/2013 1359   BASOSABS 0.0 10/07/2013 1359

## 2013-10-16 NOTE — Assessment & Plan Note (Signed)
I suspect he has either combined or diastolic dysfunction as a source of his heart failure from uncontrolled diabetes and hypertension and weight.  I discontinued Verapamil. I've begun carvedilol at 6.25 twice a day. We discontinued his HCTZ and changed his Lasix to 40 mg every morning. He is instructed in a potassium rich diet. We'll followup in 2 weeks with Peterson Lombard RN for blood pressure and heart rate check. I have ordered a 2-D echocardiogram to assess LV chamber size and function.  We have talked at length about the importance of preventative healthcare in regards to tight blood sugar control, weight loss of about 20 pounds, and tight blood pressure control. We have given him instructions on how to obtain an affordable glucometer. We will try throughout away for him to afford his medicines but he is dual eligible which I pointed out to his wife.  Continue to cut down his cigarettes which is now down to 2 or 3 a day.

## 2013-10-16 NOTE — Patient Instructions (Addendum)
You have been started on Coreg 6.25 mg twice daily. Your hydrochlorothiazide has been discontinued. Verapamil has been discontinued. Your lasix has been increased to 40 mg daily. Your insulin has been reordered as well as a glucometer. Eat a high-potassium diet. A list of foods that are high in potassium are listed below.  Thank you!  Potassium Content of Foods Potassium is a mineral found in many foods and drinks. It helps keep fluids and minerals balanced in your body and also affects how steadily your heart beats. The body needs potassium to control blood pressure and to keep the muscles and nervous system healthy. However, certain health conditions and medicine may require you to eat more or less potassium-rich foods and drinks. Your caregiver or dietitian will tell you how much potassium you should have each day. COMMON SERVING SIZES The list below tells you how big or small common portion sizes are:  1 oz.........4 stacked dice.  3 oz........Marland Kitchen.Deck of cards.  1 tsp.......Marland Kitchen.Tip of little finger.  1 tbsp....Marland Kitchen.Marland Kitchen.Thumb.  2 tbsp....Marland Kitchen.Marland Kitchen.Golf ball.   c..........Marland Kitchen.Half of a fist.  1 c...........Marland Kitchen.A fist. FOODS AND DRINKS HIGH IN POTASSIUM More than 200 mg of potassium per serving. A serving size is  c (120 mL or noted gram weight) unless otherwise stated. While all the items on this list are high in potassium, some items are higher in potassium than others. Fruits  Apricots (sliced), 83 g.  Apricots (dried halves), 3 oz / 24 g.  Avocado (cubed),  c / 50 g.  Banana (sliced), 75 g.  Cantaloupe (cubed), 80 g.  Dates (pitted), 5 whole / 35 g.  Figs (dried), 4 whole / 32 g.  Guava, c / 55 g.  Honeydew, 1 wedge / 85 g.  Kiwi (sliced), 90 g.  Nectarine, 1 small / 129 g.  Orange, 1 medium / 131 g.  Orange juice.  Pomegranate seeds, 87 g.  Pomegranate juice.  Prunes (pitted), 3 whole / 30 g.  Prune juice, 3 oz / 90 mL.  Seedless raisins, 3 tbsp / 27  g. Vegetables  Artichoke,  of a medium / 64 g.  Asparagus (boiled), 90 g.  Baked beans,  c / 63 g.  Bamboo shoots,  c / 38 g.  Beets (cooked slices), 85 g.  Broccoli (boiled), 78 g.  Brussels sprout (boiled), 78 g.  Butternut squash (baked), 103 g.  Chickpea (cooked), 82 g.  Green peas (cooked), 80 g.  Hubbard squash (baked cubes),  c / 68 g.  Kidney beans (cooked), 5 tbsp / 55 g.  Lima beans (cooked),  c / 43 g.  Navy beans (cooked),  c / 61 g.  Potato (baked), 61 g.  Potato (boiled), 78 g.  Pumpkin (boiled), 123 g.  Refried beans,  c / 79 g.  Spinach (cooked),  c / 45 g.  Split peas (cooked),  c / 65 g.  Sun-dried tomatoes, 2 tbsp / 7 g.  Sweet potato (baked),  c / 50 g.  Tomato (chopped or sliced), 90 g.  Tomato juice.  Tomato paste, 4 tsp / 21 g.  Tomato sauce,  c / 61 g.  Vegetable juice.  White mushrooms (cooked), 78 g.  Yam (cooked or baked),  c / 34 g.  Zucchini squash (boiled), 90 g. Other Foods and Drinks  Almonds (whole),  c / 36 g.  Cashews (oil roasted),  c / 32 g.  Chocolate milk.  Chocolate pudding, 142 g.  Clams (steamed), 1.5 oz / 43  g.  Dark chocolate, 1.5 oz / 42 g.  Fish, 3 oz / 85 g.  King crab (steamed), 3 oz / 85 g.  Lobster (steamed), 4 oz / 113 g.  Milk (skim, 1%, 2%, whole), 1 c / 240 mL.  Milk chocolate, 2.3 oz / 66 g.  Milk shake.  Nonfat fruit variety yogurt, 123 g.  Peanuts (oil roasted), 1 oz / 28 g.  Peanut butter, 2 tbsp / 32 g.  Pistachio nuts, 1 oz / 28 g.  Pumpkin seeds, 1 oz / 28 g.  Red meat (broiled, cooked, grilled), 3 oz / 85 g.  Scallops (steamed), 3 oz / 85 g.  Shredded wheat cereal (dry), 3 oblong biscuits / 75 g.  Spaghetti sauce,  c / 66 g.  Sunflower seeds (dry roasted), 1 oz / 28 g.  Veggie burger, 1 patty / 70 g. FOODS MODERATE IN POTASSIUM Between 150 mg and 200 mg per serving. A serving is  c (120 mL or noted gram weight) unless otherwise  stated. Fruits  Grapefruit,  of the fruit / 123 g.  Grapefruit juice.  Pineapple juice.  Plums (sliced), 83 g.  Tangerine, 1 large / 120 g. Vegetables  Carrots (boiled), 78 g.  Carrots (sliced), 61 g.  Rhubarb (cooked with sugar), 120 g.  Rutabaga (cooked), 120 g.  Sweet corn (cooked), 75 g.  Yellow snap beans (cooked), 63 g. Other Foods and Drinks   Bagel, 1 bagel / 98 g.  Chicken breast (roasted and chopped),  c / 70 g.  Chocolate ice cream / 66 g.  Pita bread, 1 large / 64 g.  Shrimp (steamed), 4 oz / 113 g.  Swiss cheese (diced), 70 g.  Vanilla ice cream, 66 g.  Vanilla pudding, 140 g. FOODS LOW IN POTASSIUM Less than 150 mg per serving. A serving size is  cup (120 mL or noted gram weight) unless otherwise stated. If you eat more than 1 serving of a food low in potassium, the food may be considered a food high in potassium. Fruits  Apple (slices), 55 g.  Apple juice.  Applesauce, 122 g.  Blackberries, 72 g.  Blueberries, 74 g.  Cranberries, 50 g.  Cranberry juice.  Fruit cocktail, 119 g.  Fruit punch.  Grapes, 46 g.  Grape juice.  Mandarin oranges (canned), 126 g.  Peach (slices), 77 g.  Pineapple (chunks), 83 g.  Raspberries, 62 g.  Red cherries (without pits), 78 g.  Strawberries (sliced), 83 g.  Watermelon (diced), 76 g. Vegetables  Alfalfa sprouts, 17 g.  Bell peppers (sliced), 46 g.  Cabbage (shredded), 35 g.  Cauliflower (boiled), 62 g.  Celery, 51 g.  Collard greens (boiled), 95 g.  Cucumber (sliced), 52 g.  Eggplant (cubed), 41 g.  Green beans (boiled), 63 g.  Lettuce (shredded), 1 c / 36 g.  Onions (sauteed), 44 g.  Radishes (sliced), 58 g.  Spaghetti squash, 51 g. Other Foods and Drinks  [employer], 1 slice / 28 g.  Black tea.  Brown rice (cooked), 98 g.  Butter croissant, 1 medium / 57 g.  Carbonated soda.  Coffee.  Cheddar cheese (diced), 66 g.  Corn flake cereal (dry),  14 g.  Cottage cheese, 118 g.  Cream of rice cereal (cooked), 122 g.  Cream of wheat cereal (cooked), 126 g.  Crisped rice cereal (dry), 14 g.  Egg (boiled, fried, poached, omelet, scrambled), 1 large / 46 61 g.  English muffin, 1 muffin /  57 g.  Frozen ice pop, 1 pop / 55 g.  Graham cracker, 1 large rectangular cracker / 14 g.  Jelly beans, 112 g.  Non-dairy whipped topping.  Oatmeal, 88 g.  Orange sherbet, 74 g.  Puffed rice cereal (dry), 7 g.  Pasta (cooked), 70 g.  Rice cakes, 4 cakes / 36 g.  Sugared doughnut, 4 oz / 116 g.  White bread, 1 slice / 30 g.  White rice (cooked), 79 93 g.  Wild rice (cooked), 82 g.  Yellow cake, 1 slice / 68 g. Document Released: 12/21/2004 Document Revised: 04/25/2012 Document Reviewed: 09/23/2011 [facility] Patient Information 2014 [place].

## 2013-10-16 NOTE — Assessment & Plan Note (Signed)
His potassium was normal in emergency room. We have instructed him on a potassium rich diet is also on ACE inhibitor. We'll check metabolic profile when he returns to see Shanda Bumps in 2 weeks.

## 2013-10-28 ENCOUNTER — Ambulatory Visit (HOSPITAL_COMMUNITY)
Admission: RE | Admit: 2013-10-28 | Discharge: 2013-10-28 | Disposition: A | Discharge status: Home / Self Care | Payer: Medicare Other | Source: Ambulatory Visit | Attending: Internal Medicine | Admitting: Internal Medicine

## 2013-10-28 DIAGNOSIS — I110 Hypertensive heart disease with heart failure: Secondary | ICD-10-CM

## 2013-10-28 DIAGNOSIS — I509 Heart failure, unspecified: Secondary | ICD-10-CM | POA: Insufficient documentation

## 2013-10-28 DIAGNOSIS — I059 Rheumatic mitral valve disease, unspecified: Secondary | ICD-10-CM

## 2013-10-28 NOTE — Progress Notes (Signed)
Echo Lab  2D Echocardiogram completed.  Bradley Mason L Shetara Launer, RDCS 10/28/2013 10:31 AM

## 2013-10-30 ENCOUNTER — Ambulatory Visit: Payer: Medicare Other | Attending: Internal Medicine | Admitting: *Deleted

## 2013-10-30 ENCOUNTER — Ambulatory Visit: Payer: Medicare Other

## 2013-10-30 VITALS — BP 146/104 | HR 98 | Resp 16 | Ht 67.0 in | Wt 234.0 lb

## 2013-10-30 DIAGNOSIS — I10 Essential (primary) hypertension: Secondary | ICD-10-CM

## 2013-10-30 DIAGNOSIS — I509 Heart failure, unspecified: Principal | ICD-10-CM

## 2013-10-30 DIAGNOSIS — I110 Hypertensive heart disease with heart failure: Secondary | ICD-10-CM

## 2013-10-30 LAB — BASIC METABOLIC PANEL
BUN: 6 mg/dL (ref 6–23)
CHLORIDE: 102 meq/L (ref 96–112)
CO2: 24 meq/L (ref 19–32)
CREATININE: 0.71 mg/dL (ref 0.50–1.35)
Calcium: 9.2 mg/dL (ref 8.4–10.5)
GLUCOSE: 148 mg/dL — AB (ref 70–99)
POTASSIUM: 4.3 meq/L (ref 3.5–5.3)
Sodium: 135 mEq/L (ref 135–145)

## 2013-10-30 MED ORDER — LISINOPRIL 20 MG PO TABS: 40.0000 mg | ORAL_TABLET | Freq: Every day | ORAL | Status: AC

## 2013-10-30 MED ORDER — CARVEDILOL 6.25 MG PO TABS: 12.5000 mg | ORAL_TABLET | Freq: Two times a day (BID) | ORAL | Status: AC

## 2013-10-30 NOTE — Patient Instructions (Signed)
Your goal heart rate (pulse) is 60-70.

## 2013-10-30 NOTE — Progress Notes (Signed)
Patient in today for blood pressure check and lab work. Patient's blood pressure is still elevated. Per Dr. Kem Parkinson has been increased to 12.5 mg twice a day and Lisinopril increased to 40 mg once a day in the morning. Dr. Daleen Squibb did discuss Echo results with patient. Patient informed that we need to get his blood pressure down to get the heart stronger. Patient verbalized understanding. Patient is to return in 2 weeks for a blood pressure check. Annamaria Helling, RN

## 2016-01-26 IMAGING — CT CT ABD-PELV W/ CM
2 of 5 series · 16 of 46 positions shown, 18 images · IV contrast (APPLIED)
Comparison: None.

CLINICAL DATA: Rule out gangrene.  Testicular abscess.

EXAM:
CT ABDOMEN AND PELVIS WITH CONTRAST
TECHNIQUE: Multidetector CT imaging of the abdomen and pelvis was performed
using the standard protocol following bolus administration of
intravenous contrast.
CONTRAST:  100mL OMNIPAQUE IOHEXOL 300 MG/ML  SOLN

[Series 2: abd/ pelvis 5.0 i30f 1 · axial · 0.76mm/px · z∈[-486,-36]mm · 13 of 106 slices shown, 15 images]
[im 8/106  soft-tissue]
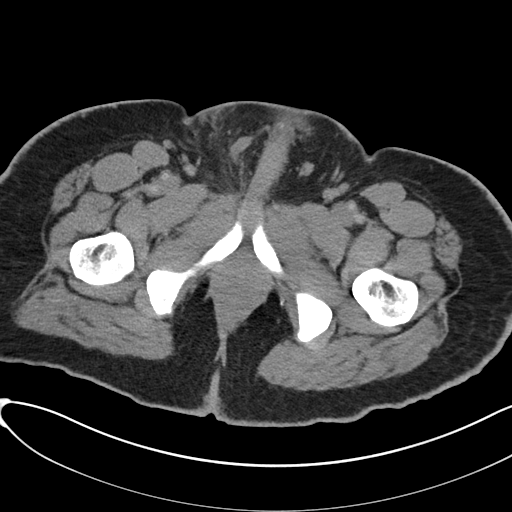
[im 8/106  bone]
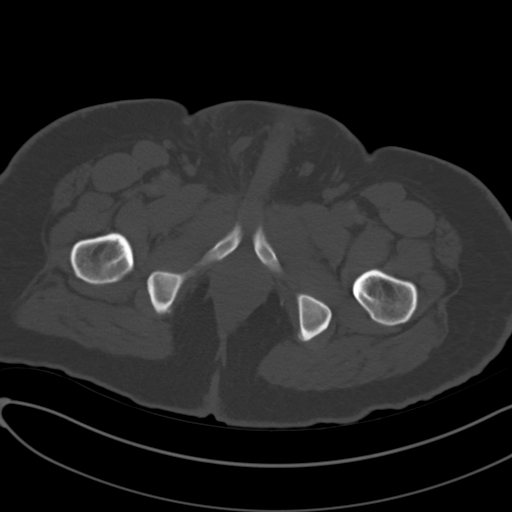
[im 16/106  soft-tissue]
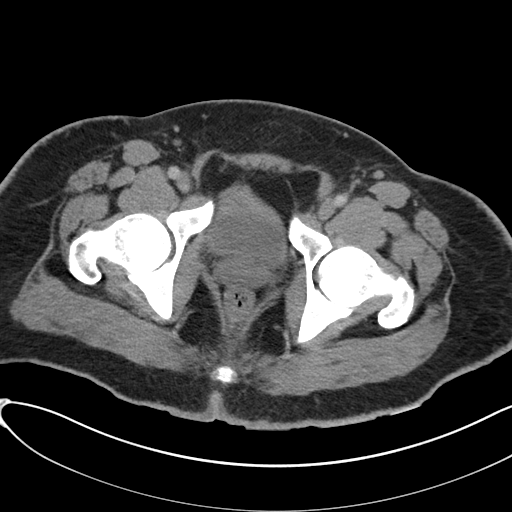
[im 23/106  soft-tissue]
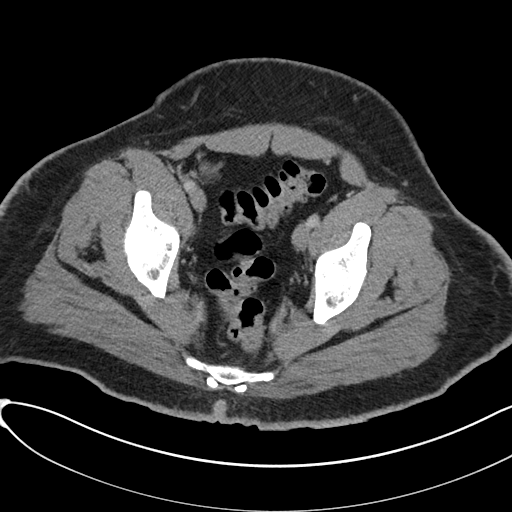
[im 31/106  soft-tissue]
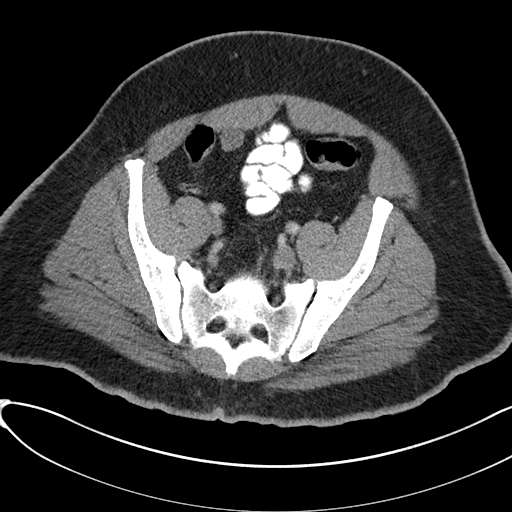
[im 38/106  soft-tissue]
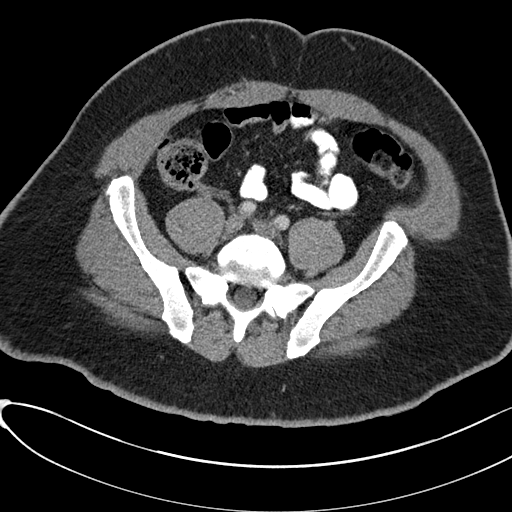
[im 46/106  soft-tissue]
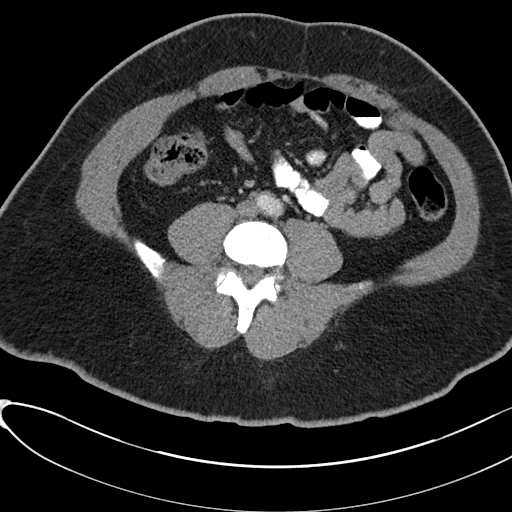
[im 53/106  soft-tissue]
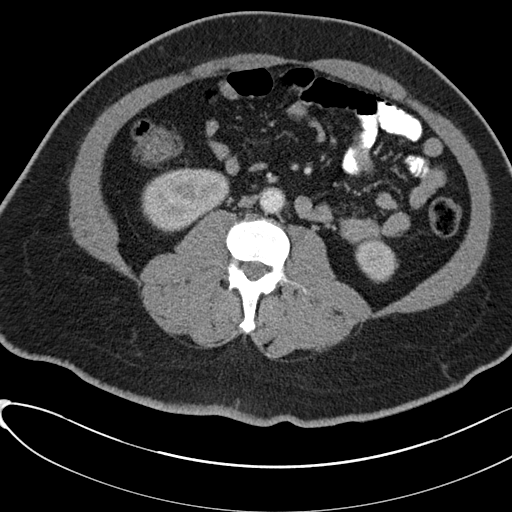
[im 61/106  soft-tissue]
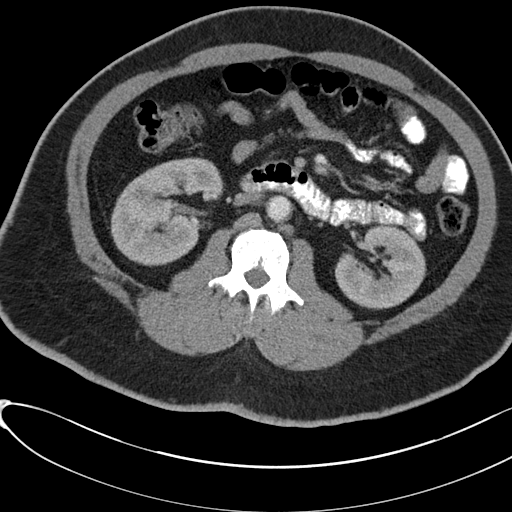
[im 68/106  soft-tissue]
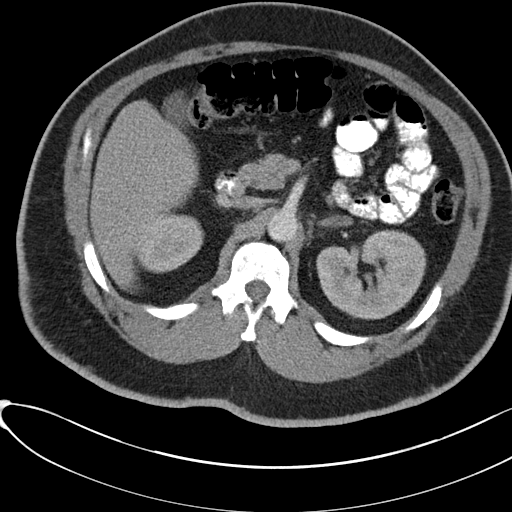
[im 68/106  bone]
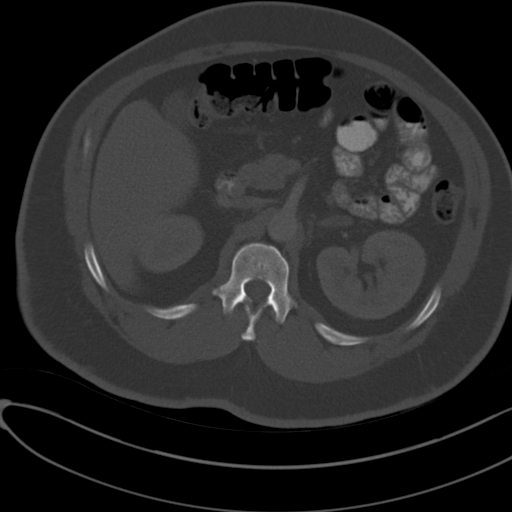
[im 76/106  soft-tissue]
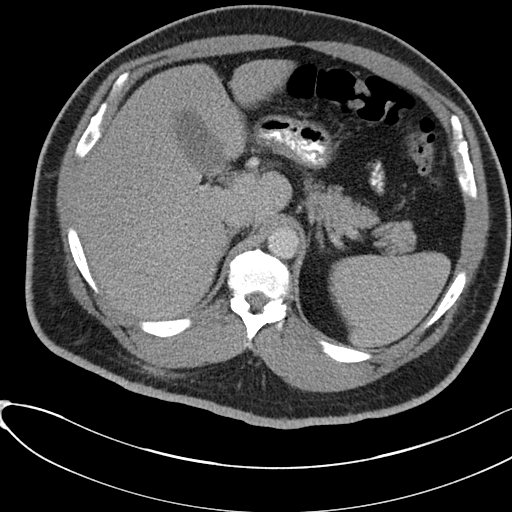
[im 83/106  soft-tissue]
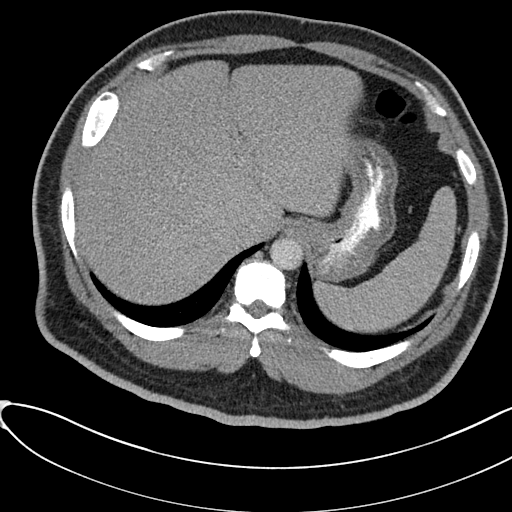
[im 91/106  soft-tissue]
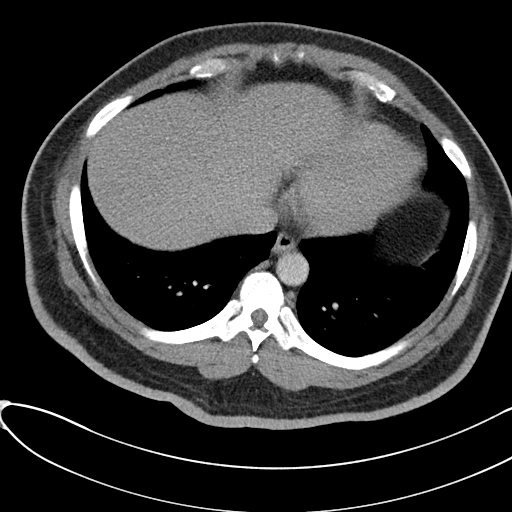
[im 98/106  soft-tissue]
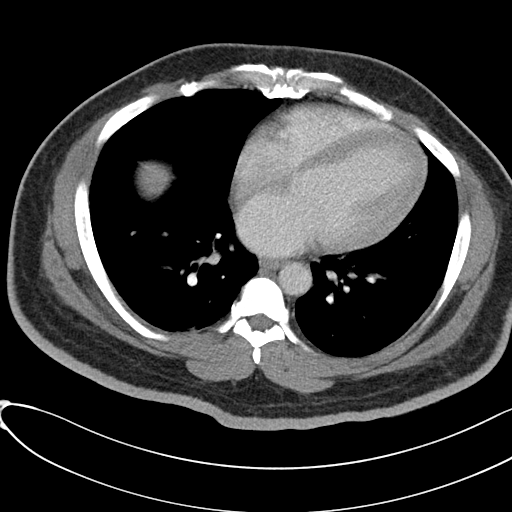

[Series 6: coronals · coronal · 0.76mm/px · 3 of 158 slices shown]
[im 53/158  soft-tissue]
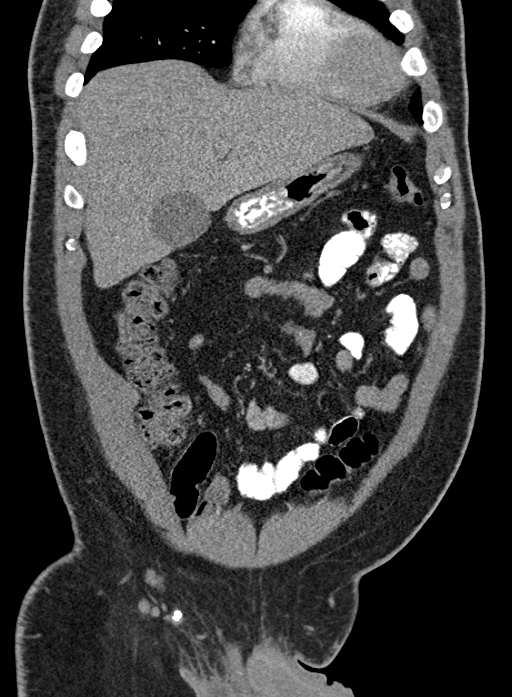
[im 70/158  soft-tissue]
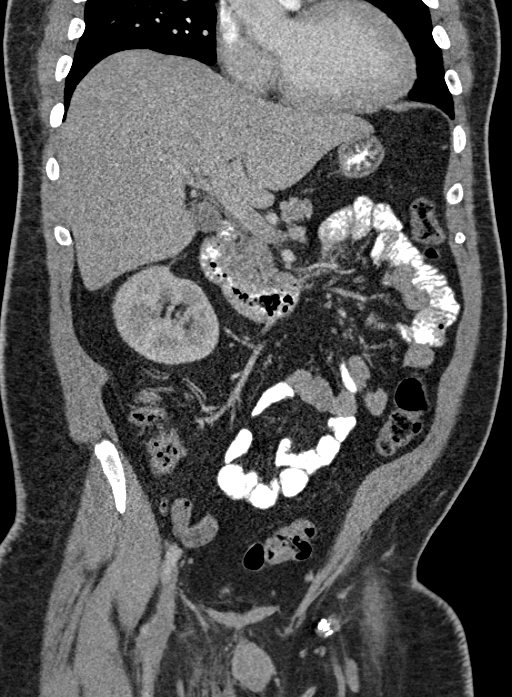
[im 88/158  soft-tissue]
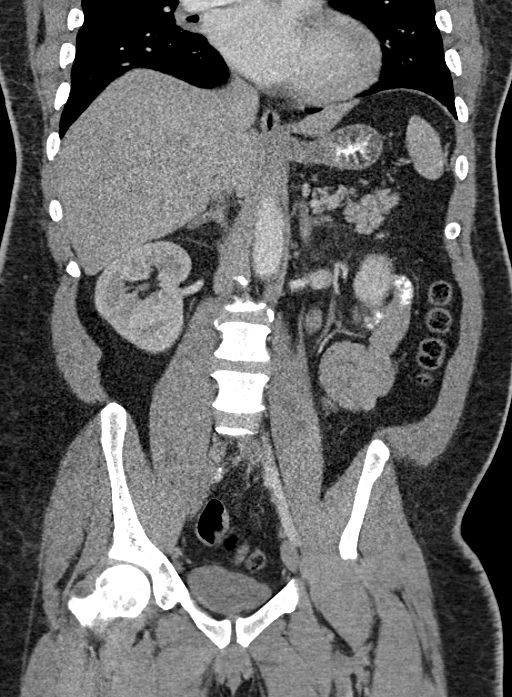

[16 of 46 positions shown; findings below may reference images not displayed]

FINDINGS: Lung bases are clear. No effusions. Heart is normal size.

Liver, gallbladder, spleen, pancreas, adrenals and kidneys are
normal. Appendix is visualized and is normal. Stranding and fluid
are noted in the right inguinal canal. No evidence of gas within the
soft tissues to suggest gangrene. Calcifications within both
inguinal canal is, presumably calcified lymph nodes.

Urinary bladder is decompressed. Stomach, large and small bowel are
unremarkable. No free fluid, free air or adenopathy. No acute bony
abnormality.
IMPRESSION: Fluid and stranding within the right inguinal canal and likely
within the visualized upper right scrotum. No evidence of gangrene.

Normal appendix.
